# Patient Record
Sex: Female | Born: 1935 | Race: White | Hispanic: No | Smoking: Current every day smoker
Health system: Southern US, Community
[De-identification: ages and names within clinical notes are randomized; demographics above are authoritative.]

## PROBLEM LIST (undated history)

## (undated) DIAGNOSIS — K589 Irritable bowel syndrome without diarrhea: Secondary | ICD-10-CM

## (undated) DIAGNOSIS — L509 Urticaria, unspecified: Secondary | ICD-10-CM

## (undated) DIAGNOSIS — G5600 Carpal tunnel syndrome, unspecified upper limb: Secondary | ICD-10-CM

## (undated) DIAGNOSIS — Z90710 Acquired absence of both cervix and uterus: Secondary | ICD-10-CM

## (undated) DIAGNOSIS — K529 Noninfective gastroenteritis and colitis, unspecified: Secondary | ICD-10-CM

## (undated) DIAGNOSIS — J309 Allergic rhinitis, unspecified: Secondary | ICD-10-CM

## (undated) DIAGNOSIS — F32A Depression, unspecified: Secondary | ICD-10-CM

## (undated) DIAGNOSIS — M1990 Unspecified osteoarthritis, unspecified site: Secondary | ICD-10-CM

## (undated) DIAGNOSIS — L239 Allergic contact dermatitis, unspecified cause: Secondary | ICD-10-CM

## (undated) DIAGNOSIS — D649 Anemia, unspecified: Secondary | ICD-10-CM

## (undated) DIAGNOSIS — F329 Major depressive disorder, single episode, unspecified: Secondary | ICD-10-CM

## (undated) DIAGNOSIS — K5790 Diverticulosis of intestine, part unspecified, without perforation or abscess without bleeding: Secondary | ICD-10-CM

## (undated) DIAGNOSIS — I10 Essential (primary) hypertension: Secondary | ICD-10-CM

## (undated) DIAGNOSIS — Z9889 Other specified postprocedural states: Secondary | ICD-10-CM

## (undated) DIAGNOSIS — R0989 Other specified symptoms and signs involving the circulatory and respiratory systems: Secondary | ICD-10-CM

## (undated) DIAGNOSIS — R0789 Other chest pain: Secondary | ICD-10-CM

## (undated) DIAGNOSIS — L309 Dermatitis, unspecified: Secondary | ICD-10-CM

## (undated) DIAGNOSIS — R112 Nausea with vomiting, unspecified: Secondary | ICD-10-CM

## (undated) HISTORY — PX: CARPAL TUNNEL RELEASE: SHX101

## (undated) HISTORY — PX: ABDOMINAL HYSTERECTOMY: SHX81

## (undated) HISTORY — PX: JOINT REPLACEMENT: SHX530

---

## 2001-02-12 HISTORY — PX: OOPHORECTOMY: SHX86

## 2001-02-12 HISTORY — PX: RECTAL SURGERY: SHX760

## 2003-09-03 ENCOUNTER — Other Ambulatory Visit: Payer: Self-pay

## 2004-01-12 ENCOUNTER — Ambulatory Visit: Payer: Self-pay | Admitting: Obstetrics and Gynecology

## 2004-03-14 ENCOUNTER — Ambulatory Visit: Payer: Self-pay | Admitting: Gastroenterology

## 2004-05-25 ENCOUNTER — Ambulatory Visit: Payer: Self-pay | Admitting: Orthopaedic Surgery

## 2004-06-15 ENCOUNTER — Ambulatory Visit: Payer: Self-pay | Admitting: Orthopaedic Surgery

## 2004-06-15 ENCOUNTER — Other Ambulatory Visit: Payer: Self-pay

## 2004-06-22 ENCOUNTER — Ambulatory Visit: Payer: Self-pay | Admitting: Orthopaedic Surgery

## 2004-07-11 ENCOUNTER — Ambulatory Visit: Payer: Self-pay | Admitting: Internal Medicine

## 2005-03-26 ENCOUNTER — Ambulatory Visit: Payer: Self-pay | Admitting: Gastroenterology

## 2005-08-03 ENCOUNTER — Ambulatory Visit: Payer: Self-pay | Admitting: Internal Medicine

## 2006-02-12 HISTORY — PX: BACK SURGERY: SHX140

## 2006-08-06 ENCOUNTER — Ambulatory Visit: Payer: Self-pay | Admitting: Internal Medicine

## 2006-08-20 ENCOUNTER — Inpatient Hospital Stay: Payer: Self-pay | Admitting: Internal Medicine

## 2006-08-23 ENCOUNTER — Inpatient Hospital Stay (HOSPITAL_COMMUNITY): Admission: AD | Admit: 2006-08-23 | Discharge: 2006-08-25 | Payer: Self-pay | Admitting: Neurological Surgery

## 2006-10-11 ENCOUNTER — Ambulatory Visit (HOSPITAL_COMMUNITY): Admission: RE | Admit: 2006-10-11 | Discharge: 2006-10-11 | Payer: Self-pay | Admitting: Neurological Surgery

## 2006-10-11 ENCOUNTER — Ambulatory Visit: Payer: Self-pay | Admitting: Surgery

## 2006-10-11 ENCOUNTER — Encounter (INDEPENDENT_AMBULATORY_CARE_PROVIDER_SITE_OTHER): Payer: Self-pay | Admitting: Neurological Surgery

## 2007-09-18 ENCOUNTER — Ambulatory Visit: Payer: Self-pay | Admitting: Internal Medicine

## 2007-10-01 ENCOUNTER — Ambulatory Visit: Payer: Self-pay | Admitting: Internal Medicine

## 2007-11-02 ENCOUNTER — Emergency Department: Payer: Self-pay | Admitting: Emergency Medicine

## 2007-12-10 ENCOUNTER — Ambulatory Visit: Payer: Self-pay | Admitting: Internal Medicine

## 2008-04-28 ENCOUNTER — Ambulatory Visit: Payer: Self-pay | Admitting: Internal Medicine

## 2008-10-06 ENCOUNTER — Ambulatory Visit: Payer: Self-pay | Admitting: Internal Medicine

## 2009-08-18 ENCOUNTER — Ambulatory Visit: Payer: Self-pay | Admitting: Ophthalmology

## 2009-08-30 ENCOUNTER — Ambulatory Visit: Payer: Self-pay | Admitting: Ophthalmology

## 2009-09-14 ENCOUNTER — Ambulatory Visit: Payer: Self-pay | Admitting: Ophthalmology

## 2009-10-07 ENCOUNTER — Ambulatory Visit: Payer: Self-pay | Admitting: Internal Medicine

## 2010-06-27 NOTE — H&P (Signed)
NAMECARYSSA, Powell NO.:  [id number]   MEDICAL RECORD NO.:  [id number]          PATIENT TYPE:  INP   LOCATION:  3005                         FACILITY:  [facility]   PHYSICIAN:  Stefani Dama, M.D.  DATE OF BIRTH:  08-May-1932   DATE OF ADMISSION:  08/23/2006  DATE OF DISCHARGE:                              HISTORY & PHYSICAL   CHIEF COMPLAINT:  Severe back and right leg pain with weakness.   HISTORY OF PRESENT ILLNESS:  This is a 77 year old individual who has  had significant problems with back pain chronically, however, this past  Monday she developed severe pain in the right lower extremity that was  unrelenting.  She notes that she developed weakness in the right leg and  the leg felt numb.  She was hospitalized around about Wednesday at  [facility] by Dr. Maurice March and an MRI was undertaken and the study  demonstrates that the patient has severe degenerative changes at the  level of L4-L5 particularly but now there is an acute disk herniation at  the L4-L5 level.  The patient is transferred here for further care.  She  notes that she has had chronic problems with back pain but this has been  felt to be nonsurgical and this has been managed conservatively.  She  notes that her general health is fair.  She has some hypertension and  some high cholesterol.   CURRENT MEDICATIONS:  Her current medications include Avapro, Caltrate,  estradiol 0.25 mg, and Celebrex 200 mg.   ALLERGIES:  She has no known drug allergies save prednisone which she  does not tolerate well but she is allergic to.   SOCIAL HISTORY:  She lives independently.   PHYSICAL EXAMINATION:  NEUROLOGICAL:  On physical examination she is an  alert and oriented, cooperative individual in moderate distress with  back pain.  She turns about very slowly from side-to-side, notes that  there is a sharp, lancinating pain radiating into the right lower  extremity when she does so.  She cannot stand  independently and when she  does stand she tends to favor a 10 degree forward stoop.  She notes that  getting into the requisition is extremely painful.  There is marked  paraspinous spasm to palpation and  percussion with the patient in the  semi erect position.  Her motor strength reveals iliopsoas and  quadriceps strength are 5/5, tibialis anterior, however, is weak to 2/5,  gastroc strength is 4+/5.  Deep tendon reflexes are 2+ in the patellae  and 1+ the Achilles.  Babinski's are downgoing.  Sensation appears  intact in the proximal lower extremity, however, is decreased around the  ankle on the right side.  GENERAL:  The general physical examination reveals the patient is a  moderately obese individual.  HEENT: Examination is normal.  NECK:  The neck is normal, movements are supple.  Range of motion is  good.  No masses are noted in the neck and no bruits are heard.  There  are no masses in the supraclavicular fossa.  LUNGS:  The lungs are  clear to auscultation.  HEART:  The heart has a regular rate and rhythm.  ABDOMEN:  The abdomen is soft and protuberant.  Bowel sounds are  positive.  No masses are palpable.  EXTREMITIES:  The extremities reveal no cyanosis, clubbing, or edema.   IMPRESSION:  The patient has an acute herniated nucleus pulposus on top  of a spondylitic joint at L4-L5 on the right side.  She has an acute  foot drop and so far her pain has been intractable despite parenteral  narcotics.  She has been advised regarding surgical intervention.  She  is now being taken to the operating room to undergo surgical  decompression at L4-L5.      Stefani Dama, M.D.  Electronically Signed     HJE/MEDQ  D:  08/23/2006  T:  08/25/2006  Job:  [id number]   cc:   Stefani Dama, M.D.

## 2010-06-27 NOTE — Discharge Summary (Signed)
[name], [name] NO.:  [id number]   MEDICAL RECORD NO.:  [id number]          PATIENT TYPE:  INP   LOCATION:  3005                         FACILITY:  [facility]   PHYSICIAN:  Stefani Dama, M.D.  DATE OF BIRTH:  [birth year]   DATE OF ADMISSION:  08/23/2006  DATE OF DISCHARGE:  08/25/2006                               DISCHARGE SUMMARY   ADMITTING DIAGNOSIS:  Herniated nucleus pulposus, L4-L5, with right  lumbar radiculopathy, spondylosis and stenosis, lumbar spine.   POSTOPERATIVE DISCHARGE/FINAL DIAGNOSIS:  Herniated nucleus pulposus, L4-  L5, with right lumbar radiculopathy, spondylosis and stenosis, lumbar  spine.   MAJOR OPERATION:  Lumbar microdiskectomy, L4-L5, right, with operating  microscope microdissection technique.   CONDITION ON DISCHARGE:  Improving.   HOSPITAL COURSE:  Paula Powell is a 77 year old individual who was  hospitalized at [facility] because of severe unrelenting pain and  weakness in the right lower extremity.  She has a known history of  spondylosis of the lumbar spine with anterolisthesis and  spondylolisthesis at L4-L5.  An MRI was performed at [facility] which  demonstrated the presence of a herniated nucleus pulposus under the L5  nerve root on that right side.  When she arrived she at the hospital,  she was having poor pain management despite parenteral morphine.  She  had marked weakness with 0/5 tibialis anterior function.  She was  advised regarding surgical extirpation and was taken to the operating  room on the evening of August 23, 2006.  She underwent a microdiskectomy.  Postoperatively she had moderate relief of her pain, and she had some  restoration of function with now 2-3/5 tibialis anterior function in  that right foot.  Her incision is clean and dry.  She is off of  parenteral pain medications and easily tolerating Percocet for pain  control.  She is ambulatory with the aid of a walker, and at this time,  she  is discharged home.  She will be followed up in 2-3 weeks time as an  outpatient.  Her condition on discharge is improving.      Stefani Dama, M.D.  Electronically Signed     HJE/MEDQ  D:  08/25/2006  T:  08/26/2006  Job:  [id number]   cc:   Juanetta Beets, MD

## 2010-06-27 NOTE — Op Note (Signed)
Paula, Powell NO.:  [id number]   MEDICAL RECORD NO.:  [id number]          PATIENT TYPE:  INP   LOCATION:  3005                         FACILITY:  [facility]   PHYSICIAN:  Stefani Dama, M.D.  DATE OF BIRTH:  1932/12/09   DATE OF PROCEDURE:  08/23/2006  DATE OF DISCHARGE:                               OPERATIVE REPORT   PREOPERATIVE DIAGNOSIS:  Herniated nucleus pulposus, L4-L5, right with  right lumbar radiculopathy.   POSTOPERATIVE DIAGNOSIS:  Herniated nucleus pulposus, L4-L5, right with  right lumbar radiculopathy.   PROCEDURE:  Right microdiskectomy of L4-L5 with operating microscope and  microdissection technique.   SURGEON:  Stefani Dama, M.D.   FIRST ASSISTANT:  Hilda Lias, M.D.   ANESTHESIA:  General endotracheal.   INDICATIONS:  Paula Powell is a 77 year old individual, who has had  significant back pain with right lower extremity pain and weakness in  the right foot.  She developed an acute footdrop earlier this week.  She  was hospitalized at. __________  Hospital, and after an MRI demonstrated  the presence of a disk herniation and tight stenosis at the L4-L5 level,  she was transferred here for further care.  She is taken to the  operating room now.   PROCEDURE:  The patient was brought to the operating room supine on the  stretcher.  After smooth induction of general endotracheal anesthesia,  she was turned prone.  The back was prepped with alcohol and DuraPrep  and draped in a sterile fashion.  A midline incision was created over  the region of L4-L5.  This dissection was carried down to the right side  of midline.  Paraspinous fascia was opened at the L4-L5 interspace, and  a subperiosteal dissection was performed so as to expose the  interlaminar space at L4-L5.  A self-retaining retractor was placed in  the wound. A laminotomy was created, removing the inferior margin of  lamina of L4 out to the medial wall of facette.   Dissection was taken  cephalad and laterally.  The common dural tube was exposed, and the  takeoff of the L5 nerve root was exposed, also.  The nerve root was  noted be pushed laterally and dorsally with a mass underlying it right  in the region of the axilla.  With dissection of the foramen, the nerve  root could be mobilized superiorly and medially.  Underneath this was  found a significant free fragment of disk material.  This was removed in  a single fragment and allowed for immediate decompression of L5 nerve  root.  Further dissection revealed some small fragments of disk  material.  The disk itself did present with any significant hole within  it.  The disk space itself and ligaments were covered all around.  It  was, therefore, not entered.  With the L5 nerve root being well  decompressed with this procedure, care was taken to inspect that the  common dural tube was intact throughout the site of the laminotomy.  Hemostasis in the soft tissues was obtained meticulously. The wound was  irrigated  copiously with antibiotic irrigating solution, and then 40 mg  of Depo-Medrol along with 1 cc of fentanyl was left in the epidural  space. The microscope was withdrawn.  Lumbodorsal fascia was closed #1  Vicryl interrupted fashion,  2-0 Vicryl was used in the subcutaneous  tissues, 3-0 Vicryl subcuticularly, and dry sterile dressing was used on  the skin.  The patient tolerated the procedure well and was returned to  recovery room in stable condition.      Stefani Dama, M.D.  Electronically Signed     HJE/MEDQ  D:  08/23/2006  T:  08/25/2006  Job:  [id number]

## 2010-11-28 ENCOUNTER — Ambulatory Visit: Payer: Self-pay | Admitting: Internal Medicine

## 2010-11-28 LAB — URINALYSIS, ROUTINE W REFLEX MICROSCOPIC
Bilirubin Urine: NEGATIVE
Specific Gravity, Urine: 1.011
Urobilinogen, UA: 0.2

## 2010-11-28 LAB — CBC
Hemoglobin: 13.8
RBC: 4.28
WBC: 7.8

## 2010-11-28 LAB — BASIC METABOLIC PANEL
CO2: 26
Calcium: 8.9
GFR calc Af Amer: >60
GFR calc non Af Amer: >60
Sodium: 138

## 2010-11-28 LAB — PROTIME-INR: INR: 1

## 2010-11-28 LAB — URINE MICROSCOPIC-ADD ON

## 2011-07-28 ENCOUNTER — Emergency Department: Payer: Self-pay | Admitting: Emergency Medicine

## 2011-11-29 ENCOUNTER — Ambulatory Visit: Payer: Self-pay | Admitting: Internal Medicine

## 2012-12-01 ENCOUNTER — Ambulatory Visit: Payer: Self-pay | Admitting: Family Medicine

## 2013-05-29 ENCOUNTER — Ambulatory Visit: Payer: Self-pay | Admitting: Gastroenterology

## 2013-06-04 ENCOUNTER — Ambulatory Visit: Payer: Self-pay | Admitting: Gastroenterology

## 2013-06-08 LAB — PATHOLOGY REPORT

## 2013-07-01 ENCOUNTER — Other Ambulatory Visit: Payer: Self-pay | Admitting: Family

## 2013-07-07 ENCOUNTER — Other Ambulatory Visit: Payer: Self-pay | Admitting: Family

## 2013-07-07 LAB — CLOSTRIDIUM DIFFICILE(ARMC)

## 2013-12-03 ENCOUNTER — Ambulatory Visit: Payer: Self-pay | Admitting: Family Medicine

## 2014-07-20 ENCOUNTER — Encounter: Payer: Self-pay | Admitting: *Deleted

## 2014-07-21 ENCOUNTER — Encounter: Payer: Self-pay | Admitting: *Deleted

## 2014-07-22 ENCOUNTER — Ambulatory Visit: Payer: PPO | Admitting: Anesthesiology

## 2014-07-22 ENCOUNTER — Encounter
Admission: RE | Disposition: A | Discharge status: Home / Self Care | Payer: Self-pay | Source: Ambulatory Visit | Attending: Gastroenterology

## 2014-07-22 ENCOUNTER — Ambulatory Visit
Admission: RE | Admit: 2014-07-22 | Discharge: 2014-07-22 | Disposition: A | Discharge status: Home / Self Care | Payer: PPO | Source: Ambulatory Visit | Attending: Gastroenterology | Admitting: Gastroenterology

## 2014-07-22 DIAGNOSIS — Z86010 Personal history of colonic polyps: Secondary | ICD-10-CM | POA: Diagnosis not present

## 2014-07-22 DIAGNOSIS — Z79899 Other long term (current) drug therapy: Secondary | ICD-10-CM | POA: Insufficient documentation

## 2014-07-22 DIAGNOSIS — Z90710 Acquired absence of both cervix and uterus: Secondary | ICD-10-CM | POA: Insufficient documentation

## 2014-07-22 DIAGNOSIS — D649 Anemia, unspecified: Secondary | ICD-10-CM | POA: Insufficient documentation

## 2014-07-22 DIAGNOSIS — L239 Allergic contact dermatitis, unspecified cause: Secondary | ICD-10-CM | POA: Insufficient documentation

## 2014-07-22 DIAGNOSIS — D124 Benign neoplasm of descending colon: Secondary | ICD-10-CM | POA: Insufficient documentation

## 2014-07-22 DIAGNOSIS — F329 Major depressive disorder, single episode, unspecified: Secondary | ICD-10-CM | POA: Diagnosis not present

## 2014-07-22 DIAGNOSIS — K529 Noninfective gastroenteritis and colitis, unspecified: Secondary | ICD-10-CM | POA: Diagnosis not present

## 2014-07-22 DIAGNOSIS — I10 Essential (primary) hypertension: Secondary | ICD-10-CM | POA: Diagnosis not present

## 2014-07-22 DIAGNOSIS — Z7982 Long term (current) use of aspirin: Secondary | ICD-10-CM | POA: Diagnosis not present

## 2014-07-22 DIAGNOSIS — E785 Hyperlipidemia, unspecified: Secondary | ICD-10-CM | POA: Diagnosis not present

## 2014-07-22 DIAGNOSIS — M170 Bilateral primary osteoarthritis of knee: Secondary | ICD-10-CM | POA: Diagnosis not present

## 2014-07-22 DIAGNOSIS — K5790 Diverticulosis of intestine, part unspecified, without perforation or abscess without bleeding: Secondary | ICD-10-CM | POA: Diagnosis not present

## 2014-07-22 DIAGNOSIS — K589 Irritable bowel syndrome without diarrhea: Secondary | ICD-10-CM | POA: Diagnosis not present

## 2014-07-22 DIAGNOSIS — Z888 Allergy status to other drugs, medicaments and biological substances status: Secondary | ICD-10-CM | POA: Diagnosis not present

## 2014-07-22 DIAGNOSIS — R0789 Other chest pain: Secondary | ICD-10-CM | POA: Diagnosis not present

## 2014-07-22 DIAGNOSIS — K5730 Diverticulosis of large intestine without perforation or abscess without bleeding: Secondary | ICD-10-CM | POA: Insufficient documentation

## 2014-07-22 DIAGNOSIS — K5190 Ulcerative colitis, unspecified, without complications: Secondary | ICD-10-CM | POA: Diagnosis not present

## 2014-07-22 DIAGNOSIS — R0989 Other specified symptoms and signs involving the circulatory and respiratory systems: Secondary | ICD-10-CM | POA: Insufficient documentation

## 2014-07-22 HISTORY — DX: Major depressive disorder, single episode, unspecified: F32.9

## 2014-07-22 HISTORY — DX: Urticaria, unspecified: L50.9

## 2014-07-22 HISTORY — DX: Unspecified osteoarthritis, unspecified site: M19.90

## 2014-07-22 HISTORY — DX: Other specified symptoms and signs involving the circulatory and respiratory systems: R09.89

## 2014-07-22 HISTORY — DX: Other chest pain: R07.89

## 2014-07-22 HISTORY — DX: Diverticulosis of intestine, part unspecified, without perforation or abscess without bleeding: K57.90

## 2014-07-22 HISTORY — PX: COLONOSCOPY WITH PROPOFOL: SHX5780

## 2014-07-22 HISTORY — DX: Acquired absence of both cervix and uterus: Z90.710

## 2014-07-22 HISTORY — DX: Noninfective gastroenteritis and colitis, unspecified: K52.9

## 2014-07-22 HISTORY — DX: Allergic contact dermatitis, unspecified cause: L23.9

## 2014-07-22 HISTORY — DX: Irritable bowel syndrome without diarrhea: K58.9

## 2014-07-22 HISTORY — DX: Depression, unspecified: F32.A

## 2014-07-22 HISTORY — DX: Allergic rhinitis, unspecified: J30.9

## 2014-07-22 HISTORY — DX: Anemia, unspecified: D64.9

## 2014-07-22 HISTORY — DX: Dermatitis, unspecified: L30.9

## 2014-07-22 HISTORY — DX: Essential (primary) hypertension: I10

## 2014-07-22 HISTORY — DX: Carpal tunnel syndrome, unspecified upper limb: G56.00

## 2014-07-22 SURGERY — COLONOSCOPY WITH PROPOFOL
Anesthesia: General

## 2014-07-22 MED ORDER — SODIUM CHLORIDE 0.9 % IV SOLN: INTRAVENOUS | Status: DC

## 2014-07-22 MED ORDER — PROPOFOL INFUSION 10 MG/ML OPTIME
INTRAVENOUS | Status: DC | PRN
  Administered 2014-07-22: 120 ug/kg/min via INTRAVENOUS

## 2014-07-22 MED ORDER — SODIUM CHLORIDE 0.9 % IV SOLN
INTRAVENOUS | Status: DC
  Administered 2014-07-22: 13:00:00 via INTRAVENOUS
  Administered 2014-07-22: 1000 mL via INTRAVENOUS

## 2014-07-22 MED ORDER — PROPOFOL 10 MG/ML IV BOLUS
INTRAVENOUS | Status: DC | PRN
  Administered 2014-07-22: 50 mg via INTRAVENOUS
  Administered 2014-07-22: 30 mg via INTRAVENOUS

## 2014-07-22 MED ORDER — FENTANYL CITRATE (PF) 100 MCG/2ML IJ SOLN
INTRAMUSCULAR | Status: DC | PRN
  Administered 2014-07-22: 50 ug via INTRAVENOUS

## 2014-07-22 NOTE — Anesthesia Preprocedure Evaluation (Addendum)
Anesthesia Evaluation  Patient identified by MRN, date of birth, ID band Patient awake    Reviewed: Allergy & Precautions, NPO status , Patient's Chart, lab work & pertinent test results  History of Anesthesia Complications Negative for: history of anesthetic complications  Airway Mallampati: II  TM Distance: >3 FB Neck ROM: Full    Dental  (+) Upper Dentures, Partial Lower   Pulmonary neg pulmonary ROS,  breath sounds clear to auscultation  Pulmonary exam normal       Cardiovascular hypertension, Pt. on medications Normal cardiovascular exam+ Valvular Problems/Murmurs Rhythm:Regular Rate:Normal     Neuro/Psych PSYCHIATRIC DISORDERS Anxiety Depression negative neurological ROS     GI/Hepatic Neg liver ROS, IBS   Endo/Other  negative endocrine ROS  Renal/GU negative Renal ROS  negative genitourinary   Musculoskeletal  (+) Arthritis -, Osteoarthritis,    Abdominal   Peds negative pediatric ROS (+)  Hematology  (+) anemia ,   Anesthesia Other Findings   Reproductive/Obstetrics negative OB ROS                            Anesthesia Physical Anesthesia Plan  ASA: II  Anesthesia Plan: General   Post-op Pain Management:    Induction: Intravenous  Airway Management Planned: Nasal Cannula  Additional Equipment:   Intra-op Plan:   Post-operative Plan:   Informed Consent: I have reviewed the patients History and Physical, chart, labs and discussed the procedure including the risks, benefits and alternatives for the proposed anesthesia with the patient or authorized representative who has indicated his/her understanding and acceptance.     Plan Discussed with: CRNA and Surgeon  Anesthesia Plan Comments:         Anesthesia Quick Evaluation

## 2014-07-22 NOTE — H&P (Signed)
  Date of Initial H&P:07/01/2014 History reviewed, patient examined, no change in status, stable for surgery.

## 2014-07-22 NOTE — Op Note (Signed)
[facility] Gastroenterology Patient Name: Paula Powell Procedure Date: 07/22/2014 1:23 PM MRN: [id number] Account #: [id number] Date of Birth: 10-Sep-1932 Admit Type: Outpatient Age: 81 Room: [facility] ENDO ROOM 4 Gender: Female Note Status: Finalized Procedure:         Colonoscopy Indications:       Follow-up of left-sided chronic ulcerative colitis Providers:         Lupita Dawn. Candace Cruise, MD Referring MD:      Caprice Renshaw (Referring MD) Medicines:         Monitored Anesthesia Care Complications:     No immediate complications. Procedure:         Pre-Anesthesia Assessment:                    - Prior to the procedure, a History and Physical was                     performed, and patient medications, allergies and                     sensitivities were reviewed. The patient's tolerance of                     previous anesthesia was reviewed.                    - The risks and benefits of the procedure and the sedation                     options and risks were discussed with the patient. All                     questions were answered and informed consent was obtained.                    - After reviewing the risks and benefits, the patient was                     deemed in satisfactory condition to undergo the procedure.                    After obtaining informed consent, the colonoscope was                     passed under direct vision. Throughout the procedure, the                     patient's blood pressure, pulse, and oxygen saturations                     were monitored continuously. The Colonoscope was                     introduced through the anus and advanced to the the cecum,                     identified by appendiceal orifice and ileocecal valve. The                     colonoscopy was performed without difficulty. The patient                     tolerated the procedure well. The quality of the bowel  preparation was good. Findings:  Scattered small-mouthed diverticula were found in the entire colon.      A small polyp was found in the descending colon. The polyp was sessile.       The polyp was removed with a jumbo cold forceps. Resection and retrieval       were complete.      A continuous area of nonbleeding ulcerated mucosa was present in the       sigmoid colon. Biopsies were taken with a cold forceps for histology.      A continuous area of nonbleeding ulcerated mucosa was present in the       rectum. Biopsies were taken with a cold forceps for histology.       Inflammation much improved from 2015. Descending colon and right colon       not involved.      The exam was otherwise without abnormality. Impression:        - Diverticulosis in the entire examined colon.                    - One small polyp in the descending colon. Resected and                     retrieved.                    - Mucosal ulceration. Biopsied.                    - Mucosal ulceration. Biopsied.                    - The examination was otherwise normal. Recommendation:    - Discharge patient to home.                    - Await pathology results.                    - The findings and recommendations were discussed with the                     patient.                    - Can try lowering dose to asacol HD bid. Procedure Code(s): --- Professional ---                    [phone], Colonoscopy, flexible; with biopsy, single or                     multiple Diagnosis Code(s): --- Professional ---                    D12.4, Benign neoplasm of descending colon                    K63.3, Ulcer of intestine                    K51.50, Left sided colitis without complications                    K57.30, Diverticulosis of large intestine without                     perforation or abscess without bleeding CPT copyright 2014 [employer]. All rights reserved. The codes documented in this report are preliminary  and upon coder review may  be  revised to meet current compliance requirements. Hulen Luster, MD 07/22/2014 1:43:49 PM This report has been signed electronically. Number of Addenda: 0 Note Initiated On: 07/22/2014 1:23 PM Scope Withdrawal Time: 0 hours 3 minutes 9 seconds  Total Procedure Duration: 0 hours 11 minutes 54 seconds       [facility]

## 2014-07-22 NOTE — Transfer of Care (Signed)
Immediate Anesthesia Transfer of Care Note  Patient: Paula Powell  Procedure(s) Performed: Procedure(s): COLONOSCOPY WITH PROPOFOL (N/A)  Patient Location: PACU  Anesthesia Type:General  Level of Consciousness: awake, alert  and oriented  Airway & Oxygen Therapy: Patient Spontanous Breathing and Patient connected to nasal cannula oxygen  Post-op Assessment: Report given to RN and Post -op Vital signs reviewed and stable  Post vital signs: Reviewed and stable  Last Vitals:  Filed Vitals:   07/22/14 1348  BP: 137/66  Pulse:   Temp: 36.2 C  Resp: 15    Complications: No apparent anesthesia complications

## 2014-07-22 NOTE — Anesthesia Postprocedure Evaluation (Signed)
  Anesthesia Post-op Note  Patient: Paula Powell  Procedure(s) Performed: Procedure(s): COLONOSCOPY WITH PROPOFOL (N/A)  Anesthesia type:General  Patient location: PACU  Post pain: Pain level controlled  Post assessment: Post-op Vital signs reviewed, Patient's Cardiovascular Status Stable, Respiratory Function Stable, Patent Airway and No signs of Nausea or vomiting  Post vital signs: Reviewed and stable  Last Vitals:  Filed Vitals:   07/22/14 1410  BP: 157/74  Pulse: 67  Temp:   Resp: 19    Level of consciousness: awake, alert  and patient cooperative  Complications: No apparent anesthesia complications

## 2014-07-23 ENCOUNTER — Encounter: Payer: Self-pay | Admitting: Gastroenterology

## 2014-07-23 LAB — SURGICAL PATHOLOGY

## 2014-11-24 ENCOUNTER — Other Ambulatory Visit: Payer: Self-pay | Admitting: Family Medicine

## 2014-11-24 DIAGNOSIS — Z1231 Encounter for screening mammogram for malignant neoplasm of breast: Secondary | ICD-10-CM

## 2014-12-06 ENCOUNTER — Ambulatory Visit
Admission: RE | Admit: 2014-12-06 | Discharge: 2014-12-06 | Disposition: A | Discharge status: Home / Self Care | Payer: PPO | Source: Ambulatory Visit | Attending: Family Medicine | Admitting: Family Medicine

## 2014-12-06 DIAGNOSIS — Z1231 Encounter for screening mammogram for malignant neoplasm of breast: Secondary | ICD-10-CM | POA: Diagnosis present

## 2015-05-12 DIAGNOSIS — E7800 Pure hypercholesterolemia, unspecified: Secondary | ICD-10-CM | POA: Diagnosis not present

## 2015-05-12 DIAGNOSIS — Z79899 Other long term (current) drug therapy: Secondary | ICD-10-CM | POA: Diagnosis not present

## 2015-05-19 DIAGNOSIS — Z720 Tobacco use: Secondary | ICD-10-CM | POA: Diagnosis not present

## 2015-05-19 DIAGNOSIS — F411 Generalized anxiety disorder: Secondary | ICD-10-CM | POA: Diagnosis not present

## 2015-05-19 DIAGNOSIS — I10 Essential (primary) hypertension: Secondary | ICD-10-CM | POA: Diagnosis not present

## 2015-05-19 DIAGNOSIS — K5190 Ulcerative colitis, unspecified, without complications: Secondary | ICD-10-CM | POA: Diagnosis not present

## 2015-05-19 DIAGNOSIS — Z79899 Other long term (current) drug therapy: Secondary | ICD-10-CM | POA: Diagnosis not present

## 2015-05-25 ENCOUNTER — Other Ambulatory Visit: Payer: Self-pay | Admitting: Nurse Practitioner

## 2015-05-25 ENCOUNTER — Ambulatory Visit: Admission: RE | Admit: 2015-05-25 | Payer: PPO | Source: Ambulatory Visit

## 2015-05-25 ENCOUNTER — Ambulatory Visit
Admission: RE | Admit: 2015-05-25 | Discharge: 2015-05-25 | Disposition: A | Discharge status: Home / Self Care | Payer: PPO | Source: Ambulatory Visit | Attending: Nurse Practitioner | Admitting: Nurse Practitioner

## 2015-05-25 DIAGNOSIS — N281 Cyst of kidney, acquired: Secondary | ICD-10-CM | POA: Diagnosis not present

## 2015-05-25 DIAGNOSIS — R1032 Left lower quadrant pain: Secondary | ICD-10-CM

## 2015-05-25 DIAGNOSIS — I700 Atherosclerosis of aorta: Secondary | ICD-10-CM | POA: Diagnosis not present

## 2015-05-25 DIAGNOSIS — K529 Noninfective gastroenteritis and colitis, unspecified: Secondary | ICD-10-CM | POA: Diagnosis not present

## 2015-05-25 DIAGNOSIS — Z90710 Acquired absence of both cervix and uterus: Secondary | ICD-10-CM | POA: Diagnosis not present

## 2015-05-25 DIAGNOSIS — K5732 Diverticulitis of large intestine without perforation or abscess without bleeding: Secondary | ICD-10-CM | POA: Insufficient documentation

## 2015-05-25 DIAGNOSIS — K5790 Diverticulosis of intestine, part unspecified, without perforation or abscess without bleeding: Secondary | ICD-10-CM | POA: Diagnosis not present

## 2015-05-25 DIAGNOSIS — K5730 Diverticulosis of large intestine without perforation or abscess without bleeding: Secondary | ICD-10-CM | POA: Insufficient documentation

## 2015-05-25 MED ORDER — IOPAMIDOL (ISOVUE-300) INJECTION 61%
100.0000 mL | Freq: Once | INTRAVENOUS | Status: AC | PRN
  Administered 2015-05-25: 100 mL via INTRAVENOUS

## 2015-08-03 DIAGNOSIS — M25561 Pain in right knee: Secondary | ICD-10-CM | POA: Diagnosis not present

## 2015-08-03 DIAGNOSIS — M1711 Unilateral primary osteoarthritis, right knee: Secondary | ICD-10-CM | POA: Diagnosis not present

## 2015-08-03 DIAGNOSIS — M25562 Pain in left knee: Secondary | ICD-10-CM | POA: Diagnosis not present

## 2015-08-18 DIAGNOSIS — M25562 Pain in left knee: Secondary | ICD-10-CM | POA: Diagnosis not present

## 2015-08-18 DIAGNOSIS — M170 Bilateral primary osteoarthritis of knee: Secondary | ICD-10-CM | POA: Diagnosis not present

## 2015-11-15 DIAGNOSIS — Z79899 Other long term (current) drug therapy: Secondary | ICD-10-CM | POA: Diagnosis not present

## 2015-11-15 DIAGNOSIS — J069 Acute upper respiratory infection, unspecified: Secondary | ICD-10-CM | POA: Diagnosis not present

## 2015-11-15 DIAGNOSIS — J019 Acute sinusitis, unspecified: Secondary | ICD-10-CM | POA: Diagnosis not present

## 2015-11-15 DIAGNOSIS — I10 Essential (primary) hypertension: Secondary | ICD-10-CM | POA: Diagnosis not present

## 2015-11-21 DIAGNOSIS — I10 Essential (primary) hypertension: Secondary | ICD-10-CM | POA: Diagnosis not present

## 2015-11-21 DIAGNOSIS — Z23 Encounter for immunization: Secondary | ICD-10-CM | POA: Diagnosis not present

## 2015-11-21 DIAGNOSIS — Z0000 Encounter for general adult medical examination without abnormal findings: Secondary | ICD-10-CM | POA: Diagnosis not present

## 2015-11-21 DIAGNOSIS — Z79899 Other long term (current) drug therapy: Secondary | ICD-10-CM | POA: Diagnosis not present

## 2015-12-23 ENCOUNTER — Other Ambulatory Visit: Payer: Self-pay | Admitting: Family Medicine

## 2015-12-23 DIAGNOSIS — Z1231 Encounter for screening mammogram for malignant neoplasm of breast: Secondary | ICD-10-CM

## 2016-01-27 ENCOUNTER — Ambulatory Visit: Payer: PPO

## 2016-02-17 ENCOUNTER — Ambulatory Visit: Payer: PPO

## 2016-02-17 DIAGNOSIS — B301 Conjunctivitis due to adenovirus: Secondary | ICD-10-CM | POA: Diagnosis not present

## 2016-03-15 ENCOUNTER — Ambulatory Visit
Admission: RE | Admit: 2016-03-15 | Discharge: 2016-03-15 | Disposition: A | Discharge status: Home / Self Care | Payer: PPO | Source: Ambulatory Visit | Attending: Family Medicine | Admitting: Family Medicine

## 2016-03-15 DIAGNOSIS — Z1231 Encounter for screening mammogram for malignant neoplasm of breast: Secondary | ICD-10-CM | POA: Insufficient documentation

## 2016-03-21 DIAGNOSIS — M1711 Unilateral primary osteoarthritis, right knee: Secondary | ICD-10-CM | POA: Diagnosis not present

## 2016-03-21 DIAGNOSIS — M25562 Pain in left knee: Secondary | ICD-10-CM | POA: Diagnosis not present

## 2016-05-09 DIAGNOSIS — K51219 Ulcerative (chronic) proctitis with unspecified complications: Secondary | ICD-10-CM | POA: Diagnosis not present

## 2016-05-09 DIAGNOSIS — R1032 Left lower quadrant pain: Secondary | ICD-10-CM | POA: Diagnosis not present

## 2016-05-09 DIAGNOSIS — K51911 Ulcerative colitis, unspecified with rectal bleeding: Secondary | ICD-10-CM | POA: Diagnosis not present

## 2016-05-09 DIAGNOSIS — R1031 Right lower quadrant pain: Secondary | ICD-10-CM | POA: Diagnosis not present

## 2016-05-09 DIAGNOSIS — R197 Diarrhea, unspecified: Secondary | ICD-10-CM | POA: Diagnosis not present

## 2016-05-14 DIAGNOSIS — Z79899 Other long term (current) drug therapy: Secondary | ICD-10-CM | POA: Diagnosis not present

## 2016-05-14 DIAGNOSIS — I10 Essential (primary) hypertension: Secondary | ICD-10-CM | POA: Diagnosis not present

## 2016-05-15 DIAGNOSIS — K51911 Ulcerative colitis, unspecified with rectal bleeding: Secondary | ICD-10-CM | POA: Diagnosis not present

## 2016-05-15 DIAGNOSIS — Z7689 Persons encountering health services in other specified circumstances: Secondary | ICD-10-CM | POA: Diagnosis not present

## 2016-05-15 DIAGNOSIS — R1032 Left lower quadrant pain: Secondary | ICD-10-CM | POA: Diagnosis not present

## 2016-05-15 DIAGNOSIS — R197 Diarrhea, unspecified: Secondary | ICD-10-CM | POA: Diagnosis not present

## 2016-05-15 DIAGNOSIS — R1031 Right lower quadrant pain: Secondary | ICD-10-CM | POA: Diagnosis not present

## 2016-05-21 DIAGNOSIS — Z79899 Other long term (current) drug therapy: Secondary | ICD-10-CM | POA: Diagnosis not present

## 2016-05-21 DIAGNOSIS — R739 Hyperglycemia, unspecified: Secondary | ICD-10-CM | POA: Diagnosis not present

## 2016-05-21 DIAGNOSIS — I10 Essential (primary) hypertension: Secondary | ICD-10-CM | POA: Diagnosis not present

## 2016-05-21 DIAGNOSIS — F3341 Major depressive disorder, recurrent, in partial remission: Secondary | ICD-10-CM | POA: Diagnosis not present

## 2016-05-21 DIAGNOSIS — K5190 Ulcerative colitis, unspecified, without complications: Secondary | ICD-10-CM | POA: Diagnosis not present

## 2016-06-15 ENCOUNTER — Emergency Department: Payer: PPO

## 2016-06-15 ENCOUNTER — Emergency Department
Admission: EM | Admit: 2016-06-15 | Discharge: 2016-06-15 | Disposition: A | Discharge status: Home / Self Care | Payer: PPO | Attending: Emergency Medicine | Admitting: Emergency Medicine

## 2016-06-15 ENCOUNTER — Encounter: Payer: Self-pay | Admitting: Emergency Medicine

## 2016-06-15 DIAGNOSIS — S61213A Laceration without foreign body of left middle finger without damage to nail, initial encounter: Secondary | ICD-10-CM | POA: Diagnosis not present

## 2016-06-15 DIAGNOSIS — Y92009 Unspecified place in unspecified non-institutional (private) residence as the place of occurrence of the external cause: Secondary | ICD-10-CM | POA: Diagnosis not present

## 2016-06-15 DIAGNOSIS — S0083XA Contusion of other part of head, initial encounter: Secondary | ICD-10-CM | POA: Diagnosis not present

## 2016-06-15 DIAGNOSIS — I10 Essential (primary) hypertension: Secondary | ICD-10-CM | POA: Insufficient documentation

## 2016-06-15 DIAGNOSIS — Y939 Activity, unspecified: Secondary | ICD-10-CM | POA: Insufficient documentation

## 2016-06-15 DIAGNOSIS — S6992XA Unspecified injury of left wrist, hand and finger(s), initial encounter: Secondary | ICD-10-CM | POA: Diagnosis not present

## 2016-06-15 DIAGNOSIS — S8001XA Contusion of right knee, initial encounter: Secondary | ICD-10-CM | POA: Insufficient documentation

## 2016-06-15 DIAGNOSIS — M25562 Pain in left knee: Secondary | ICD-10-CM | POA: Diagnosis not present

## 2016-06-15 DIAGNOSIS — S0990XA Unspecified injury of head, initial encounter: Secondary | ICD-10-CM | POA: Diagnosis not present

## 2016-06-15 DIAGNOSIS — Z79899 Other long term (current) drug therapy: Secondary | ICD-10-CM | POA: Insufficient documentation

## 2016-06-15 DIAGNOSIS — Y999 Unspecified external cause status: Secondary | ICD-10-CM | POA: Diagnosis not present

## 2016-06-15 DIAGNOSIS — S8002XA Contusion of left knee, initial encounter: Secondary | ICD-10-CM | POA: Diagnosis not present

## 2016-06-15 DIAGNOSIS — R9431 Abnormal electrocardiogram [ECG] [EKG]: Secondary | ICD-10-CM | POA: Diagnosis not present

## 2016-06-15 DIAGNOSIS — W19XXXA Unspecified fall, initial encounter: Secondary | ICD-10-CM | POA: Diagnosis not present

## 2016-06-15 DIAGNOSIS — W1839XA Other fall on same level, initial encounter: Secondary | ICD-10-CM | POA: Insufficient documentation

## 2016-06-15 DIAGNOSIS — S8991XA Unspecified injury of right lower leg, initial encounter: Secondary | ICD-10-CM | POA: Diagnosis not present

## 2016-06-15 DIAGNOSIS — S0993XA Unspecified injury of face, initial encounter: Secondary | ICD-10-CM | POA: Diagnosis not present

## 2016-06-15 DIAGNOSIS — M25561 Pain in right knee: Secondary | ICD-10-CM | POA: Diagnosis not present

## 2016-06-15 MED ORDER — ACETAMINOPHEN 325 MG PO TABS
650.0000 mg | ORAL_TABLET | Freq: Once | ORAL | Status: AC
  Administered 2016-06-15: 650 mg via ORAL
  Filled 2016-06-15: qty 2

## 2016-06-15 NOTE — ED Notes (Signed)
Ace to right knee. Cut to knee and head and right elbow cleaned with wipes.

## 2016-06-15 NOTE — ED Triage Notes (Signed)
Pt via ems from home after mechanical fall. Pt states her knee hurts - knee is swollen. Pt has abrasion and hematoma to forehead, as well as superficial cut to left middle finger. Pt states she did not attempt to walk after the fall due to the knee pain. Pt states she has a lot of pain in the knee normally.

## 2016-06-15 NOTE — ED Provider Notes (Signed)
[facility] Emergency Department Provider Note  ____________________________________________   First MD Initiated Contact with Patient 06/15/16 0935     (approximate)  I have reviewed the triage vital signs and the nursing notes.   HISTORY  Chief Complaint Fall   HPI Paula Powell is a 83 y.o. female who is presenting to the emergency department after a fall. She says that she was coming onto steps outside of her house when she lost her footing and fell into the side of car port. She hit her right knee anteriorly and also the right side of her head. She did not lose consciousness. She is denying any neck pain. She denies taking any blood thinners. Says that she has bilateral knee arthritis and chronic knee pain but now is having worsening knee pain after the fall to the right lower summary. Says that she has had her last tetanus shot in 2012 and would not like to have it updated today but would rather see her primary care doctor to have it done within the next 4 years.Denies loss of consciousness and remembers the entire event.   Past Medical History:  Diagnosis Date  . Allergic eczema   . Anemia   . Carpal tunnel syndrome   . Chest pain, atypical   . Depression   . Depressive disorder   . Diverticulosis   . DJD (degenerative joint disease)   . Eczema   . Essential hypertension   . H/O: hysterectomy   . Hives   . Hyperlipidemia   . IBD (inflammatory bowel disease)   . Irritable bowel syndrome   . Rhinitis, allergic   . Right carotid bruit     There are no active problems to display for this patient.   Past Surgical History:  Procedure Laterality Date  . ABDOMINAL HYSTERECTOMY    . COLONOSCOPY WITH PROPOFOL N/A 07/22/2014   Procedure: COLONOSCOPY WITH PROPOFOL;  Surgeon: Hulen Luster, MD;  Location: [facility] ENDOSCOPY;  Service: Gastroenterology;  Laterality: N/A;    Prior to Admission medications   Medication Sig Start Date End Date Taking?  Authorizing Provider  acetaminophen (TYLENOL) 500 MG tablet Take 1,000 mg by mouth every 6 (six) hours as needed for mild pain.   Yes Historical Provider, MD  citalopram (CELEXA) 10 MG tablet Take 15 mg by mouth daily.    Yes Historical Provider, MD  cyclobenzaprine (FLEXERIL) 10 MG tablet Take 10 mg by mouth 3 (three) times daily as needed for muscle spasms.   Yes Historical Provider, MD  hydrochlorothiazide (HYDRODIURIL) 25 MG tablet Take 25 mg by mouth daily.   Yes Historical Provider, MD  losartan (COZAAR) 100 MG tablet Take 100 mg by mouth daily.   Yes Historical Provider, MD  mesalamine (LIALDA) 1.2 g EC tablet Take 4 tablets by mouth 3 (three) times daily.    Yes Historical Provider, MD    Allergies Ace inhibitors  Family History  Problem Relation Age of Onset  . Breast cancer Paternal Grandmother     Social History Social History  Substance Use Topics  . Smoking status: Never Smoker  . Smokeless tobacco: Never Used  . Alcohol use No    Review of Systems  Constitutional: No fever/chills Eyes: No visual changes. ENT: No sore throat. Cardiovascular: Denies chest pain. Respiratory: Denies shortness of breath. Gastrointestinal: No abdominal pain.  No nausea, no vomiting.  No diarrhea.  No constipation. Genitourinary: Negative for dysuria. Musculoskeletal: Negative for back pain. Skin: Negative for rash. Neurological:  Negative for headaches, focal weakness or numbness.   ____________________________________________   PHYSICAL EXAM:  VITAL SIGNS: ED Triage Vitals  Enc Vitals Group     BP 06/15/16 0932 (!) 145/83     Pulse Rate 06/15/16 0932 66     Resp 06/15/16 0932 13     Temp 06/15/16 0932 98 F (36.7 C)     Temp Source 06/15/16 0932 Oral     SpO2 06/15/16 0932 96 %     Weight 06/15/16 0935 197 lb (89.4 kg)     Height 06/15/16 0935 5\' 6"  (1.676 m)     Head Circumference --      Peak Flow --      Pain Score 06/15/16 0935 8     Pain Loc --      Pain Edu?  --      Excl. in [place]? --     Constitutional: Alert and oriented. Well appearing and in no acute distress. Eyes: Conjunctivae are normal. PERRL. EOMI. Head: Ecchymosis and very superficial abrasion over the right forehead without any active bleeding. Area is about 2 x 3 cm without any depression. Mild tenderness to palpation. Nose: No congestion/rhinnorhea. Mouth/Throat: Mucous membranes are moist.   Neck: No stridor.   Cardiovascular: Normal rate, regular rhythm. Grossly normal heart sounds.   Respiratory: Normal respiratory effort.  No retractions. Lungs CTAB. Gastrointestinal: Soft and nontender. No distention.  Musculoskeletal: Right knee with ecchymosis anteriorly and tenderness to palpation over the anterior right and superior aspect without any effusion. The patient is able to actively range the knee but only to about 30 of flexion secondary to pain. There is no ligamentous laxity. No posterior tenderness to palpation. Neurovascularly intact distal to the site of injury. Neurologic:  Normal speech and language. No gross focal neurologic deficits are appreciated. No gait instability. Skin:  Volar surface of the left middle finger with a 1 cm superficial laceration. Neurovascularly intact with full range of motion of the finger without any bony deformity. No active bleeding, pus or induration or erythema. Psychiatric: Mood and affect are normal. Speech and behavior are normal.  ____________________________________________   LABS (all labs ordered are listed, but only abnormal results are displayed)  Labs Reviewed - No data to display ____________________________________________  EKG  ED ECG REPORT I, Xitlalli Newhard,  Youlanda Roys, the attending physician, personally viewed and interpreted this ECG.   Date: 06/15/2016  EKG Time: 0934  Rate: 72  Rhythm: normal sinus rhythm with PVC times one.  Axis: normal  Intervals:none  ST&T Change: No ST segment elevation or depression. No abnormal  T-wave inversion.  ____________________________________________  RADIOLOGY  CT Head Wo Contrast (Final result)  Result time 06/15/16 10:07:40  Final result by Jennette Banker, MD (06/15/16 10:07:40)           Narrative:   CLINICAL DATA: Pt via ems from home after mechanical fall. Pt states her knee hurts - knee is swollen. Pt has abrasion and hematoma to forehead, as well as superficial cut to left middle finger. Denies LOC  EXAM: CT HEAD WITHOUT CONTRAST  TECHNIQUE: Contiguous axial images were obtained from the base of the skull through the vertex without intravenous contrast.  COMPARISON: None.  FINDINGS: Brain: No evidence of acute infarction, hemorrhage, extra-axial collection, ventriculomegaly, or mass effect. Generalized cerebral atrophy. Periventricular white matter low attenuation likely secondary to microangiopathy.  Vascular: Cerebrovascular atherosclerotic calcifications are noted.  Skull: Negative for fracture or focal lesion.  Sinuses/Orbits: Visualized portions of the orbits  are unremarkable. Visualized portions of the paranasal sinuses and mastoid air cells are unremarkable.  Other: Mild right frontal scalp soft tissue swelling.  IMPRESSION: No acute intracranial pathology.   Electronically Signed By: Kathreen Devoid On: 06/15/2016 10:07            DG Knee Complete 4 Views Right (Final result)  Result time 06/15/16 09:59:01  Final result by Barnet Glasgow, MD (06/15/16 09:59:01)           Narrative:   CLINICAL DATA: Fall. Knee pain  EXAM: RIGHT KNEE - COMPLETE 4+ VIEW  COMPARISON: None.  FINDINGS: Moderate to severe degenerative change in the lateral joint compartment with joint space narrowing and spurring. Mild degenerative change in the medial joint compartment. Advanced degenerative change in the patella.  Moderate joint effusion. Negative for fracture.  IMPRESSION: Moderate to advanced degenerative change with  joint effusion. Negative for fracture.   Electronically Signed By: Franchot Gallo M.D. On: 06/15/2016 09:59          ____________________________________________   PROCEDURES  Procedure(s) performed:   Procedures  Critical Care performed:   ____________________________________________   INITIAL IMPRESSION / ASSESSMENT AND PLAN / ED COURSE  Pertinent labs & imaging results that were available during my care of the patient were reviewed by me and considered in my medical decision making (see chart for details).  ----------------------------------------- 10:25 AM on 06/15/2016 -----------------------------------------  No fractures or acute findings found on the patient's images. Feel the effusion is likely chronic and related to her arthritis. She was able to a bili with assistance. We will place an Ace wrap on the right knee and she'll be following up with her orthopedist, Dr. Rudene Christians, who had been planning on replacing the right knee.      ____________________________________________   FINAL CLINICAL IMPRESSION(S) / ED DIAGNOSES  Fall. Knee contusion. Head injury. Finger laceration.    NEW MEDICATIONS STARTED DURING THIS VISIT:  New Prescriptions   No medications on file     Note:  This document was prepared using Dragon voice recognition software and may include unintentional dictation errors.    Orbie Pyo, MD 06/15/16 1026

## 2016-06-15 NOTE — ED Notes (Signed)
Pt discharged home after verbalizing understanding of discharge instructions; nad noted. 

## 2016-06-15 NOTE — ED Notes (Signed)
Pt to xray

## 2016-07-06 ENCOUNTER — Other Ambulatory Visit: Payer: Self-pay | Admitting: Orthopedic Surgery

## 2016-07-06 DIAGNOSIS — M1711 Unilateral primary osteoarthritis, right knee: Secondary | ICD-10-CM

## 2016-07-25 ENCOUNTER — Ambulatory Visit
Admission: RE | Admit: 2016-07-25 | Discharge: 2016-07-25 | Disposition: A | Discharge status: Home / Self Care | Payer: PPO | Source: Ambulatory Visit | Attending: Orthopedic Surgery | Admitting: Orthopedic Surgery

## 2016-07-25 DIAGNOSIS — Z96651 Presence of right artificial knee joint: Secondary | ICD-10-CM | POA: Diagnosis not present

## 2016-07-25 DIAGNOSIS — Z471 Aftercare following joint replacement surgery: Secondary | ICD-10-CM | POA: Diagnosis not present

## 2016-07-25 DIAGNOSIS — M1711 Unilateral primary osteoarthritis, right knee: Secondary | ICD-10-CM | POA: Insufficient documentation

## 2016-07-27 DIAGNOSIS — M1711 Unilateral primary osteoarthritis, right knee: Secondary | ICD-10-CM | POA: Diagnosis not present

## 2016-08-08 ENCOUNTER — Encounter
Admission: RE | Admit: 2016-08-08 | Discharge: 2016-08-08 | Disposition: A | Discharge status: Home / Self Care | Payer: PPO | Source: Ambulatory Visit | Attending: Orthopedic Surgery | Admitting: Orthopedic Surgery

## 2016-08-08 DIAGNOSIS — Z01818 Encounter for other preprocedural examination: Secondary | ICD-10-CM | POA: Diagnosis not present

## 2016-08-08 HISTORY — DX: Other specified postprocedural states: R11.2

## 2016-08-08 HISTORY — DX: Other specified postprocedural states: Z98.890

## 2016-08-08 LAB — TYPE AND SCREEN
ABO/RH(D): A POS
Antibody Screen: NEGATIVE

## 2016-08-08 LAB — BASIC METABOLIC PANEL
Anion gap: 7 (ref 5–15)
BUN: 20 mg/dL (ref 6–20)
CALCIUM: 9.3 mg/dL (ref 8.9–10.3)
CHLORIDE: 102 mmol/L (ref 101–111)
CO2: 25 mmol/L (ref 22–32)
Creatinine, Ser: 1.23 mg/dL — ABNORMAL HIGH (ref 0.44–1.00)
GFR calc non Af Amer: 40 mL/min — ABNORMAL LOW (ref >60)
GFR, EST AFRICAN AMERICAN: 46 mL/min — AB (ref >60)
Glucose, Bld: 131 mg/dL — ABNORMAL HIGH (ref 65–99)
Potassium: 3.8 mmol/L (ref 3.5–5.1)
Sodium: 134 mmol/L — ABNORMAL LOW (ref 135–145)

## 2016-08-08 LAB — CBC
HCT: 36.1 % (ref 35.0–47.0)
HEMOGLOBIN: 12.4 g/dL (ref 12.0–16.0)
MCH: 31.1 pg (ref 26.0–34.0)
MCHC: 34.5 g/dL (ref 32.0–36.0)
MCV: 90.2 fL (ref 80.0–100.0)
PLATELETS: 270 10*3/uL (ref 150–440)
RBC: 4.01 MIL/uL (ref 3.80–5.20)
RDW: 13.2 % (ref 11.5–14.5)
WBC: 8.1 10*3/uL (ref 3.6–11.0)

## 2016-08-08 LAB — SURGICAL PCR SCREEN
MRSA, PCR: NEGATIVE
Staphylococcus aureus: NEGATIVE

## 2016-08-08 LAB — PROTIME-INR
INR: 1.01
Prothrombin Time: 13.3 seconds (ref 11.4–15.2)

## 2016-08-08 LAB — URINALYSIS, ROUTINE W REFLEX MICROSCOPIC
Bilirubin Urine: NEGATIVE
GLUCOSE, UA: NEGATIVE mg/dL
Hgb urine dipstick: NEGATIVE
KETONES UR: 5 mg/dL — AB
LEUKOCYTES UA: NEGATIVE
Nitrite: NEGATIVE
PROTEIN: NEGATIVE mg/dL
Specific Gravity, Urine: 1.023 (ref 1.005–1.030)
pH: 5 (ref 5.0–8.0)

## 2016-08-08 LAB — APTT: aPTT: 27 seconds (ref 24–36)

## 2016-08-08 LAB — SEDIMENTATION RATE: Sed Rate: 56 mm/hr — ABNORMAL HIGH (ref 0–30)

## 2016-08-08 NOTE — Patient Instructions (Signed)
  Your procedure is scheduled LH:[id number] August 21, 2016. Report to Same Day Surgery. To find out your arrival time please call [phone] between 1PM - 3PM on Monday August 20, 2016.  Remember: Instructions that are not followed completely may result in serious medical risk, up to and including death, or upon the discretion of your surgeon and anesthesiologist your surgery may need to be rescheduled.    _x___ 1. Do not eat food or drink liquids after midnight. No gum chewing or hard candies.     ____ 2. No Alcohol for 24 hours before or after surgery.   ____ 3. Bring all medications with you on the day of surgery if instructed.    __x__ 4. Notify your doctor if there is any change in your medical condition     (cold, fever, infections).    __x___ 5. No smoking 24 hours prior to surgery.     Do not wear jewelry, make-up, hairpins, clips or nail polish.  Do not wear lotions, powders, or perfumes.   Do not shave 48 hours prior to surgery. Men may shave face and neck.  Do not bring valuables to the hospital.    [facility] is not responsible for any belongings or valuables.               Contacts, dentures or bridgework may not be worn into surgery.  Leave your suitcase in the car. After surgery it may be brought to your room.  For patients admitted to the hospital, discharge time is determined by your treatment team.   Patients discharged the day of surgery will not be allowed to drive home.    Please read over the following fact sheets that you were given:   [facility] Preparing for Surgery  __x__ Take these medicines the morning of surgery with A SIP OF WATER:    1. citalopram (CELEXA)  2. losartan (COZAAR)     ____ Fleet Enema (as directed)   _x___ Use CHG Soap as directed on instruction sheet  ____ Use inhalers on the day of surgery and bring to hospital day of surgery  ____ Stop metformin 2 days prior to surgery    ____ Take 1/2 of usual insulin dose the night before  surgery and none on the morning of surgery.   ____ Stop Coumadin/Plavix/aspirin on does not apply.  _x___ Stop Anti-inflammatories such as Advil, Aleve, Ibuprofen, Motrin, Naproxen, Naprosyn, Goodies powders or aspirin  products. OK to take Tylenol or Tramadol.   ____ Stop supplements until after surgery.    ____ Bring C-Pap to the hospital.

## 2016-08-08 NOTE — Pre-Procedure Instructions (Signed)
EKG  ED ECG REPORT I, Doran Stabler, the attending physician, personally viewed and interpreted this ECG.   Date: 06/15/2016  EKG Time: 0934  Rate: 72  Rhythm: normal sinus rhythm with PVC times one.  Axis: normal  Intervals:none  ST&T Change: No ST segment elevation or depression. No abnormal T-wave inversion.

## 2016-08-10 LAB — URINE CULTURE

## 2016-08-13 NOTE — Pre-Procedure Instructions (Signed)
UC CALLED AND FAXED TO [facility] AT [facility]

## 2016-08-20 MED ORDER — CEFAZOLIN SODIUM-DEXTROSE 2-4 GM/100ML-% IV SOLN
2.0000 g | Freq: Once | INTRAVENOUS | Status: AC
  Administered 2016-08-21: 2 g via INTRAVENOUS

## 2016-08-21 ENCOUNTER — Inpatient Hospital Stay: Payer: PPO | Admitting: Anesthesiology

## 2016-08-21 ENCOUNTER — Inpatient Hospital Stay: Payer: PPO

## 2016-08-21 ENCOUNTER — Inpatient Hospital Stay
Admission: RE | Admit: 2016-08-21 | Discharge: 2016-08-23 | DRG: 470 | Disposition: A | Discharge status: Skilled Nursing Facility | Payer: PPO | Source: Ambulatory Visit | Attending: Orthopedic Surgery | Admitting: Orthopedic Surgery

## 2016-08-21 ENCOUNTER — Encounter
Admission: RE | Disposition: A | Discharge status: Skilled Nursing Facility | Payer: Self-pay | Source: Ambulatory Visit | Attending: Orthopedic Surgery

## 2016-08-21 ENCOUNTER — Encounter: Payer: Self-pay | Admitting: *Deleted

## 2016-08-21 DIAGNOSIS — M25561 Pain in right knee: Secondary | ICD-10-CM | POA: Diagnosis present

## 2016-08-21 DIAGNOSIS — I10 Essential (primary) hypertension: Secondary | ICD-10-CM | POA: Diagnosis present

## 2016-08-21 DIAGNOSIS — Z96652 Presence of left artificial knee joint: Secondary | ICD-10-CM | POA: Diagnosis not present

## 2016-08-21 DIAGNOSIS — Z86010 Personal history of colonic polyps: Secondary | ICD-10-CM

## 2016-08-21 DIAGNOSIS — Z79899 Other long term (current) drug therapy: Secondary | ICD-10-CM

## 2016-08-21 DIAGNOSIS — M1711 Unilateral primary osteoarthritis, right knee: Secondary | ICD-10-CM | POA: Diagnosis not present

## 2016-08-21 DIAGNOSIS — Z7401 Bed confinement status: Secondary | ICD-10-CM | POA: Diagnosis not present

## 2016-08-21 DIAGNOSIS — Z888 Allergy status to other drugs, medicaments and biological substances status: Secondary | ICD-10-CM | POA: Diagnosis not present

## 2016-08-21 DIAGNOSIS — K589 Irritable bowel syndrome without diarrhea: Secondary | ICD-10-CM | POA: Diagnosis not present

## 2016-08-21 DIAGNOSIS — G8918 Other acute postprocedural pain: Secondary | ICD-10-CM

## 2016-08-21 DIAGNOSIS — G5600 Carpal tunnel syndrome, unspecified upper limb: Secondary | ICD-10-CM | POA: Diagnosis not present

## 2016-08-21 DIAGNOSIS — Z96651 Presence of right artificial knee joint: Secondary | ICD-10-CM | POA: Diagnosis not present

## 2016-08-21 DIAGNOSIS — Z471 Aftercare following joint replacement surgery: Secondary | ICD-10-CM | POA: Diagnosis not present

## 2016-08-21 DIAGNOSIS — F329 Major depressive disorder, single episode, unspecified: Secondary | ICD-10-CM | POA: Diagnosis not present

## 2016-08-21 DIAGNOSIS — D649 Anemia, unspecified: Secondary | ICD-10-CM | POA: Diagnosis not present

## 2016-08-21 DIAGNOSIS — M25569 Pain in unspecified knee: Secondary | ICD-10-CM | POA: Diagnosis not present

## 2016-08-21 HISTORY — PX: TOTAL KNEE ARTHROPLASTY: SHX125

## 2016-08-21 LAB — CREATININE, SERUM
CREATININE: 1 mg/dL (ref 0.44–1.00)
GFR calc Af Amer: 60 mL/min — ABNORMAL LOW (ref >60)
GFR, EST NON AFRICAN AMERICAN: 51 mL/min — AB (ref >60)

## 2016-08-21 LAB — CBC
HCT: 31.9 % — ABNORMAL LOW (ref 35.0–47.0)
HEMOGLOBIN: 10.7 g/dL — AB (ref 12.0–16.0)
MCH: 30.7 pg (ref 26.0–34.0)
MCHC: 33.7 g/dL (ref 32.0–36.0)
MCV: 91 fL (ref 80.0–100.0)
Platelets: 211 10*3/uL (ref 150–440)
RBC: 3.5 MIL/uL — AB (ref 3.80–5.20)
RDW: 13 % (ref 11.5–14.5)
WBC: 7.2 10*3/uL (ref 3.6–11.0)

## 2016-08-21 LAB — ABO/RH: ABO/RH(D): A POS

## 2016-08-21 SURGERY — ARTHROPLASTY, KNEE, TOTAL
Anesthesia: General | Laterality: Right | Wound class: Clean

## 2016-08-21 MED ORDER — BUPIVACAINE HCL (PF) 0.5 % IJ SOLN
INTRAMUSCULAR | Status: DC | PRN
  Administered 2016-08-21: 3 mL

## 2016-08-21 MED ORDER — KETAMINE HCL 50 MG/ML IJ SOLN
INTRAMUSCULAR | Status: DC | PRN
  Administered 2016-08-21 (×4): 1.6 mg via INTRAMUSCULAR

## 2016-08-21 MED ORDER — CEFAZOLIN SODIUM-DEXTROSE 2-4 GM/100ML-% IV SOLN
2.0000 g | Freq: Four times a day (QID) | INTRAVENOUS | Status: AC
  Administered 2016-08-21 – 2016-08-22 (×3): 2 g via INTRAVENOUS
  Filled 2016-08-21 (×3): qty 100

## 2016-08-21 MED ORDER — VASOPRESSIN 20 UNIT/ML IV SOLN
INTRAVENOUS | Status: AC
  Filled 2016-08-21: qty 1

## 2016-08-21 MED ORDER — ACETAMINOPHEN 325 MG PO TABS: 650.0000 mg | ORAL_TABLET | Freq: Four times a day (QID) | ORAL | Status: DC | PRN

## 2016-08-21 MED ORDER — GLYCOPYRROLATE 0.2 MG/ML IJ SOLN
INTRAMUSCULAR | Status: AC
  Filled 2016-08-21: qty 1

## 2016-08-21 MED ORDER — ONDANSETRON HCL 4 MG/2ML IJ SOLN: 4.0000 mg | Freq: Once | INTRAMUSCULAR | Status: DC | PRN

## 2016-08-21 MED ORDER — HYDROCHLOROTHIAZIDE 25 MG PO TABS
25.0000 mg | ORAL_TABLET | Freq: Every day | ORAL | Status: DC
  Administered 2016-08-22 – 2016-08-23 (×2): 25 mg via ORAL
  Filled 2016-08-21 (×2): qty 1

## 2016-08-21 MED ORDER — PROPOFOL 10 MG/ML IV BOLUS
INTRAVENOUS | Status: DC | PRN
  Administered 2016-08-21 (×4): 16 mg via INTRAVENOUS

## 2016-08-21 MED ORDER — BUPIVACAINE-EPINEPHRINE (PF) 0.25% -1:200000 IJ SOLN
INTRAMUSCULAR | Status: AC
  Filled 2016-08-21: qty 30

## 2016-08-21 MED ORDER — BUPIVACAINE LIPOSOME 1.3 % IJ SUSP
INTRAMUSCULAR | Status: AC
  Filled 2016-08-21: qty 20

## 2016-08-21 MED ORDER — NEOMYCIN-POLYMYXIN B GU 40-200000 IR SOLN
Status: AC
  Filled 2016-08-21: qty 20

## 2016-08-21 MED ORDER — MAGNESIUM HYDROXIDE 400 MG/5ML PO SUSP
30.0000 mL | Freq: Every day | ORAL | Status: DC | PRN
  Administered 2016-08-22: 30 mL via ORAL
  Filled 2016-08-21: qty 30

## 2016-08-21 MED ORDER — FENTANYL CITRATE (PF) 100 MCG/2ML IJ SOLN
INTRAMUSCULAR | Status: DC | PRN
  Administered 2016-08-21: 100 ug via INTRAVENOUS

## 2016-08-21 MED ORDER — METHOCARBAMOL 1000 MG/10ML IJ SOLN
500.0000 mg | Freq: Four times a day (QID) | INTRAVENOUS | Status: DC | PRN
  Filled 2016-08-21: qty 5

## 2016-08-21 MED ORDER — GLYCOPYRROLATE 0.2 MG/ML IJ SOLN
INTRAMUSCULAR | Status: DC | PRN
  Administered 2016-08-21: 0.2 mg via INTRAVENOUS

## 2016-08-21 MED ORDER — ACETAMINOPHEN 10 MG/ML IV SOLN
INTRAVENOUS | Status: DC | PRN
  Administered 2016-08-21: 1000 mg via INTRAVENOUS

## 2016-08-21 MED ORDER — ACETAMINOPHEN 10 MG/ML IV SOLN
INTRAVENOUS | Status: AC
  Filled 2016-08-21: qty 100

## 2016-08-21 MED ORDER — FENTANYL CITRATE (PF) 100 MCG/2ML IJ SOLN
INTRAMUSCULAR | Status: AC
  Filled 2016-08-21: qty 2

## 2016-08-21 MED ORDER — METOCLOPRAMIDE HCL 5 MG/ML IJ SOLN: 5.0000 mg | Freq: Three times a day (TID) | INTRAMUSCULAR | Status: DC | PRN

## 2016-08-21 MED ORDER — ACETAMINOPHEN 650 MG RE SUPP: 650.0000 mg | Freq: Four times a day (QID) | RECTAL | Status: DC | PRN

## 2016-08-21 MED ORDER — MESALAMINE 1.2 G PO TBEC
4.8000 g | DELAYED_RELEASE_TABLET | Freq: Every day | ORAL | Status: DC
  Administered 2016-08-22 – 2016-08-23 (×2): 4.8 g via ORAL
  Filled 2016-08-21 (×2): qty 4

## 2016-08-21 MED ORDER — ENOXAPARIN SODIUM 30 MG/0.3ML ~~LOC~~ SOLN
30.0000 mg | Freq: Two times a day (BID) | SUBCUTANEOUS | Status: DC
  Administered 2016-08-22 – 2016-08-23 (×4): 30 mg via SUBCUTANEOUS
  Filled 2016-08-21 (×4): qty 0.3

## 2016-08-21 MED ORDER — PROPOFOL 500 MG/50ML IV EMUL
INTRAVENOUS | Status: AC
  Filled 2016-08-21: qty 50

## 2016-08-21 MED ORDER — ONDANSETRON HCL 4 MG PO TABS
4.0000 mg | ORAL_TABLET | Freq: Four times a day (QID) | ORAL | Status: DC | PRN
  Administered 2016-08-23: 4 mg via ORAL
  Filled 2016-08-21: qty 1

## 2016-08-21 MED ORDER — SODIUM CHLORIDE 0.9 % IV SOLN
INTRAVENOUS | Status: DC
  Administered 2016-08-21 – 2016-08-22 (×2): via INTRAVENOUS

## 2016-08-21 MED ORDER — BUPIVACAINE-EPINEPHRINE (PF) 0.25% -1:200000 IJ SOLN
INTRAMUSCULAR | Status: DC | PRN
  Administered 2016-08-21: 30 mL

## 2016-08-21 MED ORDER — MAGNESIUM CITRATE PO SOLN
1.0000 | Freq: Once | ORAL | Status: DC | PRN
  Filled 2016-08-21: qty 296

## 2016-08-21 MED ORDER — ONDANSETRON HCL 4 MG/2ML IJ SOLN
4.0000 mg | Freq: Four times a day (QID) | INTRAMUSCULAR | Status: DC | PRN
Start: 2016-08-21 — End: 2016-08-24

## 2016-08-21 MED ORDER — DIPHENHYDRAMINE HCL 12.5 MG/5ML PO ELIX: 12.5000 mg | ORAL_SOLUTION | ORAL | Status: DC | PRN

## 2016-08-21 MED ORDER — LACTATED RINGERS IV SOLN
INTRAVENOUS | Status: DC
  Administered 2016-08-21 (×2): via INTRAVENOUS

## 2016-08-21 MED ORDER — LOSARTAN POTASSIUM 50 MG PO TABS
100.0000 mg | ORAL_TABLET | Freq: Every day | ORAL | Status: DC
  Administered 2016-08-22 – 2016-08-23 (×2): 100 mg via ORAL
  Filled 2016-08-21 (×2): qty 2

## 2016-08-21 MED ORDER — SODIUM CHLORIDE 0.9 % IV SOLN
INTRAVENOUS | Status: DC | PRN
  Administered 2016-08-21: 4 ug/kg/min via INTRAVENOUS

## 2016-08-21 MED ORDER — SODIUM CHLORIDE 0.9 % IV SOLN
INTRAVENOUS | Status: DC | PRN
  Administered 2016-08-21: 20 ug/min via INTRAVENOUS
  Administered 2016-08-21: 30 ug/min via INTRAVENOUS

## 2016-08-21 MED ORDER — SODIUM CHLORIDE 0.9 % IJ SOLN
INTRAMUSCULAR | Status: AC
Start: 2016-08-21 — End: ?
  Filled 2016-08-21: qty 150

## 2016-08-21 MED ORDER — FAMOTIDINE 20 MG PO TABS
20.0000 mg | ORAL_TABLET | Freq: Once | ORAL | Status: AC
Start: 2016-08-21 — End: 2016-08-21
  Administered 2016-08-21: 20 mg via ORAL

## 2016-08-21 MED ORDER — DEXAMETHASONE SODIUM PHOSPHATE 4 MG/ML IJ SOLN
INTRAMUSCULAR | Status: DC | PRN
  Administered 2016-08-21: 5 mg via INTRAVENOUS

## 2016-08-21 MED ORDER — CITALOPRAM HYDROBROMIDE 20 MG PO TABS
30.0000 mg | ORAL_TABLET | Freq: Every day | ORAL | Status: DC
  Administered 2016-08-21 – 2016-08-23 (×3): 30 mg via ORAL
  Filled 2016-08-21 (×3): qty 2

## 2016-08-21 MED ORDER — MORPHINE SULFATE (PF) 2 MG/ML IV SOLN
2.0000 mg | INTRAVENOUS | Status: DC | PRN
  Administered 2016-08-21 (×2): 2 mg via INTRAVENOUS
  Filled 2016-08-21 (×2): qty 1

## 2016-08-21 MED ORDER — ZOLPIDEM TARTRATE 5 MG PO TABS: 5.0000 mg | ORAL_TABLET | Freq: Every evening | ORAL | Status: DC | PRN

## 2016-08-21 MED ORDER — PROPOFOL 500 MG/50ML IV EMUL
INTRAVENOUS | Status: DC | PRN
  Administered 2016-08-21: 40 ug/kg/min via INTRAVENOUS

## 2016-08-21 MED ORDER — PHENOL 1.4 % MT LIQD
1.0000 | OROMUCOSAL | Status: DC | PRN
  Filled 2016-08-21: qty 177

## 2016-08-21 MED ORDER — LIDOCAINE HCL (PF) 2 % IJ SOLN
INTRAMUSCULAR | Status: AC
  Filled 2016-08-21: qty 2

## 2016-08-21 MED ORDER — BISACODYL 10 MG RE SUPP: 10.0000 mg | Freq: Every day | RECTAL | Status: DC | PRN

## 2016-08-21 MED ORDER — METHOCARBAMOL 500 MG PO TABS
500.0000 mg | ORAL_TABLET | Freq: Four times a day (QID) | ORAL | Status: DC | PRN
  Administered 2016-08-21 – 2016-08-23 (×6): 500 mg via ORAL
  Filled 2016-08-21 (×6): qty 1

## 2016-08-21 MED ORDER — DOCUSATE SODIUM 100 MG PO CAPS
100.0000 mg | ORAL_CAPSULE | Freq: Two times a day (BID) | ORAL | Status: DC
Start: 2016-08-21 — End: 2016-08-24
  Administered 2016-08-22 – 2016-08-23 (×4): 100 mg via ORAL
  Filled 2016-08-21 (×5): qty 1

## 2016-08-21 MED ORDER — METOCLOPRAMIDE HCL 10 MG PO TABS: 5.0000 mg | ORAL_TABLET | Freq: Three times a day (TID) | ORAL | Status: DC | PRN

## 2016-08-21 MED ORDER — CYCLOBENZAPRINE HCL 10 MG PO TABS: 10.0000 mg | ORAL_TABLET | Freq: Three times a day (TID) | ORAL | Status: DC | PRN

## 2016-08-21 MED ORDER — BUPIVACAINE LIPOSOME 1.3 % IJ SUSP
INTRAMUSCULAR | Status: DC | PRN
  Administered 2016-08-21: 70 mL

## 2016-08-21 MED ORDER — FENTANYL CITRATE (PF) 100 MCG/2ML IJ SOLN: 25.0000 ug | INTRAMUSCULAR | Status: DC | PRN

## 2016-08-21 MED ORDER — OXYCODONE HCL 5 MG PO TABS
5.0000 mg | ORAL_TABLET | ORAL | Status: DC | PRN
  Administered 2016-08-21 (×2): 5 mg via ORAL
  Administered 2016-08-21 – 2016-08-23 (×8): 10 mg via ORAL
  Administered 2016-08-23: 5 mg via ORAL
  Filled 2016-08-21 (×2): qty 2
  Filled 2016-08-21: qty 1
  Filled 2016-08-21 (×3): qty 2
  Filled 2016-08-21: qty 1
  Filled 2016-08-21 (×2): qty 2
  Filled 2016-08-21: qty 1
  Filled 2016-08-21: qty 2

## 2016-08-21 MED ORDER — MORPHINE SULFATE 10 MG/ML IJ SOLN
INTRAMUSCULAR | Status: DC | PRN
  Administered 2016-08-21: 10 mg via INTRAMUSCULAR

## 2016-08-21 MED ORDER — MENTHOL 3 MG MT LOZG
1.0000 | LOZENGE | OROMUCOSAL | Status: DC | PRN
  Filled 2016-08-21: qty 9

## 2016-08-21 SURGICAL SUPPLY — 60 items
BANDAGE ACE 6X5 VEL STRL LF (GAUZE/BANDAGES/DRESSINGS) ×3 IMPLANT
BLADE SAW 1 (BLADE) ×3 IMPLANT
BLOCK CUTTING FEMUR 4 RT (MISCELLANEOUS) IMPLANT
BLOCK CUTTING TIBIAL 4 RT MED (MISCELLANEOUS) IMPLANT
CANISTER SUCT 1200ML W/VALVE (MISCELLANEOUS) ×3 IMPLANT
CANISTER SUCT 3000ML PPV (MISCELLANEOUS) ×6 IMPLANT
CAPT KNEE TOTAL 3 ×3 IMPLANT
CATH FOL LEG HOLDER (MISCELLANEOUS) ×3 IMPLANT
CATH TRAY METER 16FR LF (MISCELLANEOUS) ×3 IMPLANT
CEMENT HV SMART SET (Cement) ×6 IMPLANT
CHLORAPREP W/TINT 26ML (MISCELLANEOUS) ×6 IMPLANT
COOLER POLAR GLACIER W/PUMP (MISCELLANEOUS) ×3 IMPLANT
CUFF TOURN 24 STER (MISCELLANEOUS) IMPLANT
CUFF TOURN 30 STER DUAL PORT (MISCELLANEOUS) IMPLANT
DRAPE SHEET LG 3/4 BI-LAMINATE (DRAPES) ×6 IMPLANT
ELECT CAUTERY BLADE 6.4 (BLADE) ×3 IMPLANT
ELECT REM PT RETURN 9FT ADLT (ELECTROSURGICAL) ×3
ELECTRODE REM PT RTRN 9FT ADLT (ELECTROSURGICAL) ×1 IMPLANT
GAUZE PETRO XEROFOAM 1X8 (MISCELLANEOUS) ×3 IMPLANT
GAUZE SPONGE 4X4 12PLY STRL (GAUZE/BANDAGES/DRESSINGS) ×3 IMPLANT
GLOVE BIOGEL PI IND STRL 9 (GLOVE) ×1 IMPLANT
GLOVE BIOGEL PI INDICATOR 9 (GLOVE) ×2
GLOVE INDICATOR 8.0 STRL GRN (GLOVE) ×3 IMPLANT
GLOVE SURG ORTHO 8.0 STRL STRW (GLOVE) ×3 IMPLANT
GLOVE SURG SYN 9.0  PF PI (GLOVE) ×2
GLOVE SURG SYN 9.0 PF PI (GLOVE) ×1 IMPLANT
GOWN SRG 2XL LVL 4 RGLN SLV (GOWNS) ×1 IMPLANT
GOWN STRL NON-REIN 2XL LVL4 (GOWNS) ×2
GOWN STRL REUS W/ TWL LRG LVL3 (GOWN DISPOSABLE) ×1 IMPLANT
GOWN STRL REUS W/ TWL XL LVL3 (GOWN DISPOSABLE) ×1 IMPLANT
GOWN STRL REUS W/TWL LRG LVL3 (GOWN DISPOSABLE) ×2
GOWN STRL REUS W/TWL XL LVL3 (GOWN DISPOSABLE) ×2
HOOD PEEL AWAY FLYTE STAYCOOL (MISCELLANEOUS) ×6 IMPLANT
IMMBOLIZER KNEE 19 BLUE UNIV (SOFTGOODS) ×3 IMPLANT
KIT PREVENA INCISION MGT 13 (CANNISTER) ×3 IMPLANT
KIT RM TURNOVER STRD PROC AR (KITS) ×3 IMPLANT
KNEE MEDACTA TIBIAL/FEMORAL BL (Knees) ×3 IMPLANT
KNIFE SCULPS 14X20 (INSTRUMENTS) ×3 IMPLANT
NDL SAFETY 18GX1.5 (NEEDLE) ×3 IMPLANT
NEEDLE SPNL 18GX3.5 QUINCKE PK (NEEDLE) ×3 IMPLANT
NEEDLE SPNL 20GX3.5 QUINCKE YW (NEEDLE) ×3 IMPLANT
NS IRRIG 1000ML POUR BTL (IV SOLUTION) ×3 IMPLANT
PACK TOTAL KNEE (MISCELLANEOUS) ×3 IMPLANT
PAD WRAPON POLAR KNEE (MISCELLANEOUS) ×1 IMPLANT
PULSAVAC PLUS IRRIG FAN TIP (DISPOSABLE) ×3
SOL .9 NS 3000ML IRR  AL (IV SOLUTION) ×2
SOL .9 NS 3000ML IRR UROMATIC (IV SOLUTION) ×1 IMPLANT
STAPLER SKIN PROX 35W (STAPLE) ×3 IMPLANT
SUCTION FRAZIER HANDLE 10FR (MISCELLANEOUS) ×2
SUCTION TUBE FRAZIER 10FR DISP (MISCELLANEOUS) ×1 IMPLANT
SUT DVC 2 QUILL PDO  T11 36X36 (SUTURE) ×2
SUT DVC 2 QUILL PDO T11 36X36 (SUTURE) ×1 IMPLANT
SUT ETHIBOND NAB CT1 #1 30IN (SUTURE) ×3 IMPLANT
SUT V-LOC 90 ABS DVC 3-0 CL (SUTURE) ×3 IMPLANT
SYR 20CC LL (SYRINGE) ×3 IMPLANT
SYR 50ML LL SCALE MARK (SYRINGE) ×6 IMPLANT
TIP FAN IRRIG PULSAVAC PLUS (DISPOSABLE) ×1 IMPLANT
TOWEL OR 17X26 4PK STRL BLUE (TOWEL DISPOSABLE) ×3 IMPLANT
TOWER CARTRIDGE SMART MIX (DISPOSABLE) ×3 IMPLANT
WRAPON POLAR PAD KNEE (MISCELLANEOUS) ×3

## 2016-08-21 NOTE — NC FL2 (Signed)
[place] LEVEL OF CARE SCREENING TOOL     IDENTIFICATION  Patient Name: Paula Powell Birthdate: [birth year] Sex: female Admission Date (Current Location): 08/21/2016  [place] and [place] Number:  Engineering geologist and Address:  [facility], [address], [place], [place] [address]      Provider Number: [id number]  Attending Physician Name and Address:  Hessie Knows, MD  Relative Name and Phone Number:       Current Level of Care: Hospital Recommended Level of Care: [place] Prior Approval Number:    Date Approved/Denied:   PASRR Number:  [id number])  Discharge Plan: SNF    Current Diagnoses: Patient Active Problem List   Diagnosis Date Noted  . Primary osteoarthritis of right knee 08/21/2016    Orientation RESPIRATION BLADDER Height & Weight     Self, Time, Situation, Place  Normal Continent Weight: 188 lb (85.3 kg) Height:  5\' 6"  (167.6 cm)  BEHAVIORAL SYMPTOMS/MOOD NEUROLOGICAL BOWEL NUTRITION STATUS   (none)  (none) Continent Diet (Regular Diet )  AMBULATORY STATUS COMMUNICATION OF NEEDS Skin   Extensive Assist Verbally Surgical wounds, Wound Vac (Incision: Right Knee, provena wound vac. )                       Personal Care Assistance Level of Assistance  Bathing, Feeding, Dressing Bathing Assistance: Limited assistance Feeding assistance: Independent Dressing Assistance: Limited assistance     Functional Limitations Info  Sight, Hearing, Speech Sight Info: Adequate Hearing Info: Adequate Speech Info: Adequate    SPECIAL CARE FACTORS FREQUENCY  PT (By licensed PT), OT (By licensed OT)     PT Frequency:  (5) OT Frequency:  (5)            Contractures      Additional Factors Info  Code Status, Allergies Code Status Info:  (Full Code. ) Allergies Info:  (Ace Inhibitors)           Current Medications (08/21/2016):  This is the current hospital active medication  list Current Facility-Administered Medications  Medication Dose Route Frequency Provider Last Rate Last Dose  . 0.9 %  sodium chloride infusion   Intravenous Continuous Hessie Knows, MD      . acetaminophen (TYLENOL) tablet 650 mg  650 mg Oral Q6H PRN Hessie Knows, MD       Or  . acetaminophen (TYLENOL) suppository 650 mg  650 mg Rectal Q6H PRN Hessie Knows, MD      . bisacodyl (DULCOLAX) suppository 10 mg  10 mg Rectal Daily PRN Hessie Knows, MD      . ceFAZolin (ANCEF) IVPB 2g/100 mL premix  2 g Intravenous Q6H Hessie Knows, MD      . citalopram (CELEXA) tablet 30 mg  30 mg Oral Daily Hessie Knows, MD      . diphenhydrAMINE (BENADRYL) 12.5 MG/5ML elixir 12.5-25 mg  12.5-25 mg Oral Q4H PRN Hessie Knows, MD      . docusate sodium (COLACE) capsule 100 mg  100 mg Oral BID Hessie Knows, MD      . Derrill Memo ON 08/22/2016] enoxaparin (LOVENOX) injection 30 mg  30 mg Subcutaneous Q12H Hessie Knows, MD      . Derrill Memo ON 08/22/2016] hydrochlorothiazide (HYDRODIURIL) tablet 25 mg  25 mg Oral Daily Hessie Knows, MD      . Derrill Memo ON 08/22/2016] losartan (COZAAR) tablet 100 mg  100 mg Oral Daily Hessie Knows, MD      .  magnesium citrate solution 1 Bottle  1 Bottle Oral Once PRN Hessie Knows, MD      . magnesium hydroxide (MILK OF MAGNESIA) suspension 30 mL  30 mL Oral Daily PRN Hessie Knows, MD      . menthol-cetylpyridinium (CEPACOL) lozenge 3 mg  1 lozenge Oral PRN Hessie Knows, MD       Or  . phenol (CHLORASEPTIC) mouth spray 1 spray  1 spray Mouth/Throat PRN Hessie Knows, MD      . Derrill Memo ON 08/22/2016] mesalamine (LIALDA) EC tablet 4.8 g  4.8 g Oral Q breakfast Hessie Knows, MD      . methocarbamol (ROBAXIN) tablet 500 mg  500 mg Oral Q6H PRN Hessie Knows, MD       Or  . methocarbamol (ROBAXIN) 500 mg in dextrose 5 % 50 mL IVPB  500 mg Intravenous Q6H PRN Hessie Knows, MD      . metoCLOPramide (REGLAN) tablet 5-10 mg  5-10 mg Oral Q8H PRN Hessie Knows, MD       Or  . metoCLOPramide  (REGLAN) injection 5-10 mg  5-10 mg Intravenous Q8H PRN Hessie Knows, MD      . morphine 2 MG/ML injection 2 mg  2 mg Intravenous Q1H PRN Hessie Knows, MD      . ondansetron [facility]) tablet 4 mg  4 mg Oral Q6H PRN Hessie Knows, MD       Or  . ondansetron [facility]) injection 4 mg  4 mg Intravenous Q6H PRN Hessie Knows, MD      . oxyCODONE (Oxy IR/ROXICODONE) immediate release tablet 5-10 mg  5-10 mg Oral Q3H PRN Hessie Knows, MD      . zolpidem (AMBIEN) tablet 5 mg  5 mg Oral QHS PRN Hessie Knows, MD         Discharge Medications: Please see discharge summary for a list of discharge medications.  Relevant Imaging Results:  Relevant Lab Results:   Additional Information  (SSN: [id number])  Esvin Hnat, Veronia Beets, LCSW

## 2016-08-21 NOTE — Anesthesia Post-op Follow-up Note (Cosign Needed)
Anesthesia QCDR form completed.        

## 2016-08-21 NOTE — Anesthesia Procedure Notes (Signed)
Spinal  Patient location during procedure: OR Start time: 08/21/2016 10:48 AM End time: 08/21/2016 10:58 AM Staffing Performed: resident/CRNA  Preanesthetic Checklist Completed: patient identified, site marked, surgical consent, pre-op evaluation, timeout performed, IV checked, risks and benefits discussed and monitors and equipment checked Spinal Block Patient position: sitting Prep: ChloraPrep Patient monitoring: heart rate, continuous pulse ox, blood pressure and cardiac monitor Approach: right paramedian Location: L3-4 Injection technique: single-shot Needle Needle type: [place]  Needle gauge: 22 G Needle length: 9 cm Additional Notes Negative paresthesia. Negative blood return. Positive free-flowing CSF. Expiration date of kit checked and confirmed. Patient tolerated procedure well, without complications. Epi wash.

## 2016-08-21 NOTE — Transfer of Care (Signed)
Immediate Anesthesia Transfer of Care Note  Patient: Paula Powell  Procedure(s) Performed: Procedure(s): TOTAL KNEE ARTHROPLASTY (Right)  Patient Location: PACU  Anesthesia Type:Spinal  Level of Consciousness: awake, alert , oriented and patient cooperative  Airway & Oxygen Therapy: Patient Spontanous Breathing and Patient connected to nasal cannula oxygen  Post-op Assessment: Report given to RN and Post -op Vital signs reviewed and stable  Post vital signs: Reviewed and stable  Last Vitals:  Vitals:   08/21/16 0902  BP: (!) 159/88  Pulse: 72  Resp: 16  Temp: (!) 35.9 C    Last Pain:  Vitals:   08/21/16 0902  TempSrc: Tympanic  PainSc: 3          Complications: No apparent anesthesia complications

## 2016-08-21 NOTE — Progress Notes (Signed)
PT Attempt Note  Patient Details Name: Paula Powell MRN: [id number] DOB: 03/23/1932   Evaluation Attempt:    Reason Eval/Treat Not Completed: Medical issues which prohibited therapy. Attempted PT evaluation however pt still has no motor function or sensation. Will perform PT evaluation on later date as pt is appropriate.   Lyndel Safe Paula Powell PT, DPT Paula Powell,Paula Powell 08/21/2016, 3:00 PM

## 2016-08-21 NOTE — Care Management (Signed)
RNCM consult received for discharge planning. POD # 0 right TKA. Patient presents from home with her daughter, granddaughter and great granddaughter. Will follow progression. PT pending.

## 2016-08-21 NOTE — Anesthesia Preprocedure Evaluation (Addendum)
Anesthesia Evaluation  Patient identified by MRN, date of birth, ID band Patient awake    Reviewed: Allergy & Precautions, NPO status , Patient's Chart, lab work & pertinent test results  History of Anesthesia Complications (+) PONV and history of anesthetic complications  Airway Mallampati: II  TM Distance: >3 FB Neck ROM: Full    Dental  (+) Upper Dentures, Partial Lower   Pulmonary neg pulmonary ROS, Current Smoker,    Pulmonary exam normal breath sounds clear to auscultation       Cardiovascular hypertension, Pt. on medications + Peripheral Vascular Disease  Normal cardiovascular exam+ Valvular Problems/Murmurs  Rhythm:Regular Rate:Normal     Neuro/Psych PSYCHIATRIC DISORDERS Anxiety Depression negative neurological ROS     GI/Hepatic Neg liver ROS, IBS, diverticulosis   Endo/Other  negative endocrine ROS  Renal/GU negative Renal ROS  negative genitourinary   Musculoskeletal  (+) Arthritis , Osteoarthritis,    Abdominal   Peds negative pediatric ROS (+)  Hematology  (+) anemia ,   Anesthesia Other Findings   Reproductive/Obstetrics negative OB ROS                             Anesthesia Physical  Anesthesia Plan  ASA: II  Anesthesia Plan: Spinal   Post-op Pain Management:    Induction: Intravenous  PONV Risk Score and Plan:   Airway Management Planned: Nasal Cannula  Additional Equipment:   Intra-op Plan:   Post-operative Plan:   Informed Consent: I have reviewed the patients History and Physical, chart, labs and discussed the procedure including the risks, benefits and alternatives for the proposed anesthesia with the patient or authorized representative who has indicated his/her understanding and acceptance.   Dental advisory given  Plan Discussed with: CRNA and Surgeon  Anesthesia Plan Comments:       Anesthesia Quick Evaluation

## 2016-08-21 NOTE — H&P (Signed)
Reviewed paper H+P, will be scanned into chart. patient examnied No changes noted.

## 2016-08-21 NOTE — Op Note (Signed)
08/21/2016  1:17 PM  PATIENT:  Paula Powell  83 y.o. female  PRE-OPERATIVE DIAGNOSIS:  PRIMARY OSTEOARTHRITIS OF RIGHT KNEE  POST-OPERATIVE DIAGNOSIS:  PRIMARY OSTEOARTHRITIS OF RIGHT KNEE  PROCEDURE:  Procedure(s): TOTAL KNEE ARTHROPLASTY (Right)  SURGEON: Laurene Footman, MD  ASSISTANTS: Rachelle Hora [facility]  ANESTHESIA:   spinal  EBL:  Total I/O In: 4128 [I.V.:1450] Out: 440 [Urine:240; Blood:200]  BLOOD ADMINISTERED:none  DRAINS: none   LOCAL MEDICATIONS USED:  MARCAINE    and OTHER morphine and exparel  SPECIMEN:  No Specimen  DISPOSITION OF SPECIMEN:  N/A  COUNTS:  YES  TOURNIQUET:   26 minutes at 300 mmHg  IMPLANTS: Medacta GMK 4 right femur for right tibia with a 14 mm PS insert size 4 11 mm x 30 mm stem and size 2 patella all components cemented  DICTATION: .Dragon Dictation patient brought the operating room and after adequate spinal anesthesia was obtained, the right leg was prepped and draped in sterile fashion. After appropriate patient identification and timeout procedure completed a midline skin incision was made followed by medial parapatellar arthrotomy. Fat pad and anterior cruciate ligament excised. Approximately tibia exposure was obtained to get the Medacta cutting block applied from the my knee system and proximal tibia cut was carried out. The distal femoral cut was carried out a similar fashion using the my knee guide and the 4 in 1 cutting guide was applied to the distal femur anterior posterior chamfer cuts performed. At this point tibial baseplate was trialed proximal tibial preparation performed and keel punch placed followed by the 4 femur trial and a 12 mm insert was somewhat unstable medially but there was tight lateral so the femoral trial was removed and lateral releases carried out following this the 14 mm insert gave good stability in flexion and extension. With the femoral trial in place. Trial components removed and after doing the trochlear  preparation and PS drill. All trials removed and the patella was cut using a  guide and sized to size 2 after drilling holes were made. At this point a lateral release was performed because of her valgus knee this is going to be needed anyways and tourniquet was raised with local anesthetic infiltrated the bony surfaces were thoroughly irrigated and dried and all components were cemented into place with the polyethylene insert placed with set screw tightened with a torque screwdriver. After the cemented set excess cement was removed and the knee again thoroughly irrigated. The arthrotomy was then closed after letting down the tourniquet with Ethibond medially initially to help maintain patellar alignment with the lateral release followed by a heavy Quill for the capsule TXA was then injected into the directly into the joint. 3-0 v-loc was used subcutaneously followed by skin staples and covered with an incisional wound VAC.  PLAN OF CARE: Admit to inpatient   PATIENT DISPOSITION:  PACU - hemodynamically stable.

## 2016-08-22 LAB — CBC
HEMATOCRIT: 33.5 % — AB (ref 35.0–47.0)
HEMOGLOBIN: 11.2 g/dL — AB (ref 12.0–16.0)
MCH: 31.1 pg (ref 26.0–34.0)
MCHC: 33.6 g/dL (ref 32.0–36.0)
MCV: 92.6 fL (ref 80.0–100.0)
Platelets: 223 10*3/uL (ref 150–440)
RBC: 3.62 MIL/uL — ABNORMAL LOW (ref 3.80–5.20)
RDW: 13.2 % (ref 11.5–14.5)
WBC: 7.4 10*3/uL (ref 3.6–11.0)

## 2016-08-22 LAB — BASIC METABOLIC PANEL
ANION GAP: 8 (ref 5–15)
BUN: 13 mg/dL (ref 6–20)
CALCIUM: 8.8 mg/dL — AB (ref 8.9–10.3)
CO2: 25 mmol/L (ref 22–32)
CREATININE: 0.75 mg/dL (ref 0.44–1.00)
Chloride: 104 mmol/L (ref 101–111)
GFR calc non Af Amer: >60 mL/min (ref >60)
Glucose, Bld: 113 mg/dL — ABNORMAL HIGH (ref 65–99)
Potassium: 4 mmol/L (ref 3.5–5.1)
SODIUM: 137 mmol/L (ref 135–145)

## 2016-08-22 MED ORDER — METOPROLOL TARTRATE 25 MG PO TABS
25.0000 mg | ORAL_TABLET | Freq: Once | ORAL | Status: AC
  Administered 2016-08-22: 25 mg via ORAL
  Filled 2016-08-22: qty 1

## 2016-08-22 NOTE — Evaluation (Signed)
Physical Therapy Evaluation Patient Details Name: Paula Powell MRN: [id number] DOB: 03/26/1932 Today's Date: 08/22/2016   History of Present Illness  Pt underwent R TKR without reported post-op complications. Attempted PT evlaution on POD#0 but sensory and motor still impaired following surgery. PT evaluation performed on POD#1. PMH includes depression, hypertension, IBS, and PVD  Clinical Impression  Pt admitted with above diagnosis. Pt currently with functional limitations due to the deficits listed below (see PT Problem List).  Pt with significant pain with all motion of RLE. However she demonstrates good motivation for therapy and is able to complete all bed exercises as instructed. She requires minA+1 for bed mobility, transfers, and ambulation. She is able to ambulate partially to door and back to recliner. She moves very slowly with decreased weight acceptance to RLE and heavy UE support. Pt has one stumble and has to reach out to grab bed railing and therapist has to assist to stabilize. Pain increases with ambulation. At this time recommendation is for SNF placement at discharge. Will continue to work toward improving mobility in order to upgrade discharge plan. Pt will benefit from PT services to address deficits in strength, balance, and mobility in order to return to full function at home.     Follow Up Recommendations SNF    Equipment Recommendations  None recommended by PT;Other (comment) (Use RW at discharge)    Recommendations for Other Services OT consult     Precautions / Restrictions Precautions Precautions: Fall;Knee Precaution Booklet Issued: Yes (comment) Required Braces or Orthoses: Knee Immobilizer - Right Knee Immobilizer - Right: Discontinue once straight leg raise with < 10 degree lag Restrictions Weight Bearing Restrictions: Yes RLE Weight Bearing: Weight bearing as tolerated      Mobility  Bed Mobility Overal bed mobility: Needs Assistance Bed Mobility:  Supine to Sit     Supine to sit: Min assist     General bed mobility comments: Pt requires minA+1 to manage RLE. Cues for hand placement and sequencing  Transfers Overall transfer level: Needs assistance Equipment used: Rolling walker (2 wheeled) Transfers: Sit to/from Stand Sit to Stand: Min assist         General transfer comment: Pt requires 2 attempts to come to standing. Increased time and decreased weight shift to RLE. Cues for safe hand placement. Once upright pt is steady and stable in standing with UE support on rolling walker  Ambulation/Gait Ambulation/Gait assistance: Min assist Ambulation Distance (Feet): 20 Feet Assistive device: Rolling walker (2 wheeled) Gait Pattern/deviations: Step-to pattern;Decreased stance time - right;Decreased weight shift to right Gait velocity: Decreased Gait velocity interpretation: <1.8 ft/sec, indicative of risk for recurrent falls General Gait Details: Pt able to ambulate partially to door and back to recliner. She moves very slowly with decreased weight acceptance to RLE and heavy UE support. Pt has one stumble and has to reach out to grab bed and therapist has to assist to stabilize. Pain increases with ambulation  Stairs            Wheelchair Mobility    Modified Rankin (Stroke Patients Only)       Balance Overall balance assessment: Needs assistance Sitting-balance support: No upper extremity supported Sitting balance-Leahy Scale: Good     Standing balance support: Bilateral upper extremity supported Standing balance-Leahy Scale: Fair Standing balance comment: Requires UE support on walker to stabilize.  Pertinent Vitals/Pain Pain Assessment: 0-10 Pain Score: 6  Pain Location: R knee pain, increases to 9/10 with movement Pain Descriptors / Indicators: Operative site guarding Pain Intervention(s): Monitored during session;Premedicated before session    Home Living  Family/patient expects to be discharged to:: Skilled nursing facility Living Arrangements: Other relatives;Children;Other (Comment) (Lives with dtr, grandaughter, and great-granddaughter) Available Help at Discharge: Family Type of Home: House Home Access: Stairs to enter Entrance Stairs-Rails:  (Grab bar to hold) Entrance Stairs-Number of Steps: 2 Home Layout: One level Home Equipment: [place] - 2 wheels;Walker - standard;Cane - single point;Bedside commode (no shower seat, no grab bars)      Prior Function Level of Independence: Needs assistance   Gait / Transfers Assistance Needed: Pt ambulates limited distances without assistive device. 1 fall in the last 12 months  ADL's / Homemaking Assistance Needed: Independent with ADLs, assist with IADLs        Hand Dominance   Dominant Hand: Right    Extremity/Trunk Assessment   Upper Extremity Assessment Upper Extremity Assessment: Overall WFL for tasks assessed    Lower Extremity Assessment Lower Extremity Assessment: RLE deficits/detail RLE Deficits / Details: Reports intact sensation throughout RLE. Able to perform SLR and SAQ without assist but with considerable increase in pain. LLE strength is grossly WFL       Communication   Communication: No difficulties  Cognition Arousal/Alertness: Awake/alert Behavior During Therapy: WFL for tasks assessed/performed Overall Cognitive Status: Within Functional Limits for tasks assessed                                        General Comments      Exercises Total Joint Exercises Ankle Circles/Pumps: AROM;Both;10 reps;Supine Quad Sets: Strengthening;Both;10 reps;Supine Gluteal Sets: Strengthening;Both;10 reps;Supine Towel Squeeze: Strengthening;Both;10 reps;Supine Short Arc Quad: Strengthening;Right;10 reps;Supine Heel Slides: Strengthening;Right;10 reps;Supine Hip ABduction/ADduction: Strengthening;Right;10 reps;Supine Straight Leg Raises:  Strengthening;Right;10 reps;Supine Goniometric ROM: -5 to 73 degrees AAROM, pain limited   Assessment/Plan    PT Assessment Patient needs continued PT services  PT Problem List Decreased strength;Decreased range of motion;Decreased activity tolerance;Decreased balance;Decreased mobility;Pain       PT Treatment Interventions DME instruction;Gait training;Stair training;Functional mobility training;Therapeutic activities;Balance training;Therapeutic exercise;Neuromuscular re-education;Cognitive remediation;Patient/family education;Manual techniques    PT Goals (Current goals can be found in the Care Plan section)  Acute Rehab PT Goals Patient Stated Goal: Return to prior function PT Goal Formulation: With patient Time For Goal Achievement: 09/05/16 Potential to Achieve Goals: Good    Frequency BID   Barriers to discharge        Co-evaluation               AM-PAC PT "6 Clicks" Daily Activity  Outcome Measure Difficulty turning over in bed (including adjusting bedclothes, sheets and blankets)?: Total Difficulty moving from lying on back to sitting on the side of the bed? : Total Difficulty sitting down on and standing up from a chair with arms (e.g., wheelchair, bedside commode, etc,.)?: Total Help needed moving to and from a bed to chair (including a wheelchair)?: A Little Help needed walking in hospital room?: A Lot Help needed climbing 3-5 steps with a railing? : A Lot 6 Click Score: 10    End of Session Equipment Utilized During Treatment: Gait belt Activity Tolerance: Patient tolerated treatment well Patient left: in chair;with call bell/phone within reach;with chair alarm set;with SCD's reapplied;Other (comment) (polar care in place,  towel roll under heel) Nurse Communication: Mobility status PT Visit Diagnosis: Unsteadiness on feet (R26.81);Muscle weakness (generalized) (M62.81);Pain Pain - Right/Left: Right Pain - part of body: Knee    Time: 3729-0211 PT Time  Calculation (min) (ACUTE ONLY): 44 min   Charges:   PT Evaluation $PT Eval Low Complexity: 1 Procedure PT Treatments $Therapeutic Exercise: 8-22 mins   PT G Codes:   PT G-Codes **NOT FOR INPATIENT CLASS** Functional Assessment Tool Used: AM-PAC 6 Clicks Basic Mobility Functional Limitation: Mobility: Walking and moving around Mobility: Walking and Moving Around Current Status (D5520): At least 60 percent but less than 80 percent impaired, limited or restricted Mobility: Walking and Moving Around Goal Status [phone]): At least 1 percent but less than 20 percent impaired, limited or restricted    Phillips Grout PT, DPT    Alante Weimann 08/22/2016, 11:19 AM

## 2016-08-22 NOTE — Plan of Care (Signed)
Patient IV lost.  Dr. Ree Kida and gave verbal ok to not place new line, since probable DC for 7/12

## 2016-08-22 NOTE — Progress Notes (Signed)
Physical Therapy Treatment Patient Details Name: Paula Powell MRN: [id number] DOB: 08-31-1932 Today's Date: 08/22/2016    History of Present Illness Pt underwent R TKR without reported post-op complications. Attempted PT evlaution on POD#0 but sensory and motor still impaired following surgery. PT evaluation performed on POD#1. PMH includes depression, hypertension, IBS, and PVD    PT Comments    Pt demonstrates continued progress with physical therapy this afternoon. She is able to increase her ambulation distance but continues to move very slowly. Requires frequent cues for safe use of walker and safety with turns. Increase in R knee pain with all exercises and mobility. Able to complete exercises as instructed but with notable increase in knee pain during SAQ. Will continue to progress ambulation distance tomorrow as tolerated. Pt will benefit from PT services to address deficits in strength, balance, and mobility in order to return to full function at home.     Follow Up Recommendations  SNF     Equipment Recommendations  None recommended by PT;Other (comment) (Use RW at discharge)    Recommendations for Other Services OT consult     Precautions / Restrictions Precautions Precautions: Fall;Knee Precaution Booklet Issued: Yes (comment) Required Braces or Orthoses: Knee Immobilizer - Right Knee Immobilizer - Right: Discontinue once straight leg raise with < 10 degree lag Restrictions Weight Bearing Restrictions: Yes RLE Weight Bearing: Weight bearing as tolerated    Mobility  Bed Mobility Overal bed mobility: Needs Assistance Bed Mobility: Supine to Sit;Sit to Supine     Supine to sit: Min assist Sit to supine: Min assist   General bed mobility comments: Pt requires minA+1 to manage RLE. Cues for hand placement and sequencing. Improving speed. Pain increases and pt is very guarded with RLE during movement  Transfers Overall transfer level: Needs assistance Equipment  used: Rolling walker (2 wheeled) Transfers: Sit to/from Stand Sit to Stand: Min assist         General transfer comment: Improved strength with transfers today. Increase in pain and decreased weight shift to RLE. Fair stability in standing with UE support  Ambulation/Gait Ambulation/Gait assistance: Min assist Ambulation Distance (Feet): 80 Feet Assistive device: Rolling walker (2 wheeled) Gait Pattern/deviations: Step-to pattern;Decreased stance time - right;Decreased weight shift to right Gait velocity: Decreased Gait velocity interpretation: <1.8 ft/sec, indicative of risk for recurrent falls General Gait Details: Pt is able to significantly increase her ambulation distance this afternoon. She moves slowly with decreased weight shift to RLE and increase in pain. Denies DOE with ambulation. Cues for proper sequencing with walker. Cues to stay within confines of walker as pt tends to lift or push walker far out in front of her body while ambulating   Stairs            Wheelchair Mobility    Modified Rankin (Stroke Patients Only)       Balance Overall balance assessment: Needs assistance Sitting-balance support: No upper extremity supported Sitting balance-Leahy Scale: Good     Standing balance support: Bilateral upper extremity supported Standing balance-Leahy Scale: Fair Standing balance comment: Requires UE support on walker to stabilize.                             Cognition Arousal/Alertness: Awake/alert Behavior During Therapy: WFL for tasks assessed/performed Overall Cognitive Status: Within Functional Limits for tasks assessed  Exercises Total Joint Exercises Ankle Circles/Pumps: AROM;Both;10 reps;Supine Quad Sets: Strengthening;Both;10 reps;Supine Gluteal Sets: Strengthening;Both;10 reps;Supine Towel Squeeze: Strengthening;Both;10 reps;Supine Short Arc Quad: Strengthening;Right;10  reps;Supine Heel Slides: Strengthening;Right;10 reps;Supine Hip ABduction/ADduction: Strengthening;Right;10 reps;Supine Straight Leg Raises: Strengthening;Right;10 reps;Supine    General Comments        Pertinent Vitals/Pain Pain Assessment: 0-10 Pain Score: 10-Worst pain ever Pain Location: R knee pain, increases to 9/10 with movement Pain Descriptors / Indicators: Operative site guarding    Home Living                      Prior Function            PT Goals (current goals can now be found in the care plan section) Acute Rehab PT Goals Patient Stated Goal: Return to prior function PT Goal Formulation: With patient Time For Goal Achievement: 09/05/16 Potential to Achieve Goals: Good Progress towards PT goals: Progressing toward goals    Frequency    BID      PT Plan Current plan remains appropriate    Co-evaluation              AM-PAC PT "6 Clicks" Daily Activity  Outcome Measure  Difficulty turning over in bed (including adjusting bedclothes, sheets and blankets)?: Total Difficulty moving from lying on back to sitting on the side of the bed? : Total Difficulty sitting down on and standing up from a chair with arms (e.g., wheelchair, bedside commode, etc,.)?: Total Help needed moving to and from a bed to chair (including a wheelchair)?: A Little Help needed walking in hospital room?: A Lot Help needed climbing 3-5 steps with a railing? : A Lot 6 Click Score: 10    End of Session Equipment Utilized During Treatment: Gait belt Activity Tolerance: Patient tolerated treatment well Patient left: with call bell/phone within reach;with SCD's reapplied;Other (comment);in bed;with bed alarm set (polar care and bone foam in place) Nurse Communication: Mobility status PT Visit Diagnosis: Unsteadiness on feet (R26.81);Muscle weakness (generalized) (M62.81);Pain Pain - Right/Left: Right Pain - part of body: Knee     Time: 5188-4166 PT Time Calculation  (min) (ACUTE ONLY): 30 min  Charges:  $Gait Training: 8-22 mins $Therapeutic Exercise: 8-22 mins                    G Codes:      Lyndel Safe Rupert Azzara PT, DPT     Prather Failla 08/22/2016, 5:05 PM

## 2016-08-22 NOTE — Clinical Social Work Placement (Signed)
   CLINICAL SOCIAL WORK PLACEMENT  NOTE  Date:  08/22/2016  Patient Details  Name: LOREAL SCHUESSLER MRN: [id number] Date of Birth: Sep 26, 1932  Clinical Social Work is seeking post-discharge placement for this patient at the [place] level of care (*CSW will initial, date and re-position this form in  chart as items are completed):  Yes   Patient/family provided with [facility] Work Department's list of facilities offering this level of care within the geographic area requested by the patient (or if unable, by the patient's family).  Yes   Patient/family informed of their freedom to choose among providers that offer the needed level of care, that participate in Medicare, Medicaid or managed care program needed by the patient, have an available bed and are willing to accept the patient.  Yes   Patient/family informed of [facility]'s ownership interest in [facility] and [facility], as well as of the fact that they are under no obligation to receive care at these facilities.  PASRR submitted to EDS on 08/21/16     PASRR number received on 08/21/16     Existing PASRR number confirmed on       FL2 transmitted to all facilities in geographic area requested by pt/family on 08/21/16     FL2 transmitted to all facilities within larger geographic area on       Patient informed that his/her managed care company has contracts with or will negotiate with certain facilities, including the following:        Yes   Patient/family informed of bed offers received.  Patient [facility] bed at  [facility] )     Physician recommends and patient chooses bed at      Patient to be transferred to   on  .  Patient to be transferred to facility by       Patient family notified on   of transfer.  Name of family member notified:        PHYSICIAN       Additional Comment:    _______________________________________________ Marquasia Schmieder, Veronia Beets,  LCSW 08/22/2016, 1:56 PM

## 2016-08-22 NOTE — Clinical Social Work Note (Addendum)
Clinical Social Work Assessment  Patient Details  Name: NECHUMA BOVEN MRN: [id number] Date of Birth: Dec 03, 1932  Date of referral:  08/22/16               Reason for consult:  Facility Placement                Permission sought to share information with:  Chartered certified accountant granted to share information::  Yes, Verbal Permission Granted  Name::      [place]::   [place]   Relationship::     Contact Information:     Housing/Transportation Living arrangements for the past 2 months:  [place] of Information:  Patient Patient Interpreter Needed:  None Criminal Activity/Legal Involvement Pertinent to Current Situation/Hospitalization:  No - Comment as needed Significant Relationships:  Adult Children Lives with:  Adult Children Do you feel safe going back to the place where you live?  Yes Need for family participation in patient care:  Yes (Comment)  Care giving concerns:  Patient lives in [place] with her daughter Arbie Cookey, granddaughter and great-granddaughter.     Social Worker assessment / plan:  Holiday representative (CSW) received SNF consult. PT is recommending SNF. CSW met with patient to discuss D/C plan. Patient was alert and oriented x4 and was laying in the bed. CSW introduced self and explained role of CSW department. Patient reported that she lives in [place] with her daughter, granddaughter and great granddaughter. CSW explained that PT is recommending SNF and that Health Team will have to approve SNF stay. Patient verbalized her understanding and is agreeable to SNF search in [place]. FL2 complete and faxed out.   CSW presented be offers. Patient chose [facility]. CSW attempted to contact Health Team case manager twice and left a voicemail. [facility] admissions coordinator at [facility] is aware of above. CSW will continue to follow and assist as needed.   Health Team case manager  called CSW back and stated that she is working on SNF authorization for patient.   Employment status:  Retired Nurse, adult PT Recommendations:  [employer] / Referral to community resources:  [employer]  Patient/Family's Response to care:  Patient is agreeable to D/C to [facility].   Patient/Family's Understanding of and Emotional Response to Diagnosis, Current Treatment, and Prognosis:  Patient was very pleasant and thanked CSW for assistance.   Emotional Assessment Appearance:  Appears stated age Attitude/Demeanor/Rapport:    Affect (typically observed):  Accepting, Adaptable, Pleasant Orientation:  Oriented to Self, Oriented to Place, Oriented to Situation, Oriented to  Time Alcohol / Substance use:  Not Applicable Psych involvement (Current and /or in the community):  No (Comment)  Discharge Needs  Concerns to be addressed:  Discharge Planning Concerns Readmission within the last 30 days:  No Current discharge risk:  Dependent with Mobility Barriers to Discharge:  Continued Medical Work up   [employer], Veronia Beets, LCSW 08/22/2016, 1:59 PM

## 2016-08-22 NOTE — Progress Notes (Signed)
OT Cancellation Note  Patient Details Name: DOROTHA HIRSCHI MRN: [id number] DOB: [birth year]   Cancelled Treatment:    Reason Eval/Treat Not Completed: Pain limiting ability to participate. Order received, chart reviewed. Upon attempt, pt with "12/10" pain with pain meds given recently. Pt politely but adamantly declining OT evaluation at this time stating, "I don't mean to be ugly, but I couldn't move in this bed if my life depended on it." Pt agreeable to OT evaluation in the morning. Will re-attempt next day.   Jeni Salles, MPH, MS, OTR/L ascom [phone] 08/22/16, 2:19 PM

## 2016-08-22 NOTE — Progress Notes (Signed)
   Subjective: 1 Day Post-Op Procedure(s) (LRB): TOTAL KNEE ARTHROPLASTY (Right) Patient reports pain as 9 on 0-10 scale.   Patient is well, and has had no acute complaints or problems Denies any CP, SOB, ABD pain. We will continue therapy today.   Objective: Vital signs in last 24 hours: Temp:  [96.7 F (35.9 C)-98.6 F (37 C)] 98.6 F (37 C) (07/11 0453) Pulse Rate:  [47-80] 57 (07/11 0453) Resp:  [15-20] 18 (07/11 0453) BP: (126-172)/(49-88) 137/65 (07/11 0453) SpO2:  [91 %-99 %] 91 % (07/11 0453) Weight:  [85.3 kg (188 lb)-89.8 kg (198 lb)] 89.8 kg (198 lb) (07/11 0450)  Intake/Output from previous day: 07/10 0701 - 07/11 0700 In: 2532.5 [I.V.:2332.5; IV Piggyback:200] Out: 3790 [Urine:1490; Blood:200] Intake/Output this shift: No intake/output data recorded.   Recent Labs  08/21/16 1437 08/22/16 0424  HGB 10.7* 11.2*    Recent Labs  08/21/16 1437 08/22/16 0424  WBC 7.2 7.4  RBC 3.50* 3.62*  HCT 31.9* 33.5*  PLT 211 223    Recent Labs  08/21/16 1437 08/22/16 0424  NA  --  137  K  --  4.0  CL  --  104  CO2  --  25  BUN  --  13  CREATININE 1.00 0.75  GLUCOSE  --  113*  CALCIUM  --  8.8*   No results for input(s): LABPT, INR in the last 72 hours.  EXAM General - Patient is Alert, Appropriate and Oriented Extremity - Neurovascular intact Sensation intact distally Intact pulses distally Dorsiflexion/Plantar flexion intact No cellulitis present Compartment soft Dressing - dressing C/D/I and no drainage, wound vac intact Motor Function - intact, moving foot and toes well on exam.   Past Medical History:  Diagnosis Date  . Allergic eczema   . Anemia   . Carpal tunnel syndrome   . Chest pain, atypical   . Depression   . Depressive disorder   . Diverticulosis   . DJD (degenerative joint disease)   . Eczema   . Essential hypertension   . H/O: hysterectomy   . Hives   . IBD (inflammatory bowel disease)   . Irritable bowel syndrome   .  PONV (postoperative nausea and vomiting)    left knee arthroscopy  . Rhinitis, allergic   . Right carotid bruit     Assessment/Plan:   1 Day Post-Op Procedure(s) (LRB): TOTAL KNEE ARTHROPLASTY (Right) Active Problems:   Primary osteoarthritis of right knee  Estimated body mass index is 31.96 kg/m as calculated from the following:   Height as of this encounter: 5\' 6"  (1.676 m).   Weight as of this encounter: 89.8 kg (198 lb). Advance diet Up with therapy  Needs BM Recheck labs in the am CM to assist with discharge  DVT Prophylaxis - Lovenox, Foot Pumps and TED hose Weight-Bearing as tolerated to right leg   [name], PA-C [place] 08/22/2016, 8:08 AM

## 2016-08-23 DIAGNOSIS — J439 Emphysema, unspecified: Secondary | ICD-10-CM | POA: Diagnosis not present

## 2016-08-23 DIAGNOSIS — F329 Major depressive disorder, single episode, unspecified: Secondary | ICD-10-CM | POA: Diagnosis not present

## 2016-08-23 DIAGNOSIS — I2510 Atherosclerotic heart disease of native coronary artery without angina pectoris: Secondary | ICD-10-CM | POA: Diagnosis not present

## 2016-08-23 DIAGNOSIS — K589 Irritable bowel syndrome without diarrhea: Secondary | ICD-10-CM | POA: Diagnosis not present

## 2016-08-23 DIAGNOSIS — M25569 Pain in unspecified knee: Secondary | ICD-10-CM | POA: Diagnosis not present

## 2016-08-23 DIAGNOSIS — I10 Essential (primary) hypertension: Secondary | ICD-10-CM | POA: Diagnosis not present

## 2016-08-23 DIAGNOSIS — K5190 Ulcerative colitis, unspecified, without complications: Secondary | ICD-10-CM | POA: Diagnosis not present

## 2016-08-23 DIAGNOSIS — M150 Primary generalized (osteo)arthritis: Secondary | ICD-10-CM | POA: Diagnosis not present

## 2016-08-23 DIAGNOSIS — M1711 Unilateral primary osteoarthritis, right knee: Secondary | ICD-10-CM | POA: Diagnosis not present

## 2016-08-23 DIAGNOSIS — Z7401 Bed confinement status: Secondary | ICD-10-CM | POA: Diagnosis not present

## 2016-08-23 DIAGNOSIS — Z471 Aftercare following joint replacement surgery: Secondary | ICD-10-CM | POA: Diagnosis not present

## 2016-08-23 DIAGNOSIS — R6889 Other general symptoms and signs: Secondary | ICD-10-CM | POA: Diagnosis not present

## 2016-08-23 DIAGNOSIS — G5600 Carpal tunnel syndrome, unspecified upper limb: Secondary | ICD-10-CM | POA: Diagnosis not present

## 2016-08-23 DIAGNOSIS — Z96651 Presence of right artificial knee joint: Secondary | ICD-10-CM | POA: Diagnosis not present

## 2016-08-23 DIAGNOSIS — I700 Atherosclerosis of aorta: Secondary | ICD-10-CM | POA: Diagnosis not present

## 2016-08-23 DIAGNOSIS — I058 Other rheumatic mitral valve diseases: Secondary | ICD-10-CM | POA: Diagnosis not present

## 2016-08-23 DIAGNOSIS — D649 Anemia, unspecified: Secondary | ICD-10-CM | POA: Diagnosis not present

## 2016-08-23 DIAGNOSIS — R05 Cough: Secondary | ICD-10-CM | POA: Diagnosis not present

## 2016-08-23 DIAGNOSIS — R0902 Hypoxemia: Secondary | ICD-10-CM | POA: Diagnosis not present

## 2016-08-23 LAB — BASIC METABOLIC PANEL
ANION GAP: 7 (ref 5–15)
BUN: 15 mg/dL (ref 6–20)
CALCIUM: 8.9 mg/dL (ref 8.9–10.3)
CHLORIDE: 97 mmol/L — AB (ref 101–111)
CO2: 28 mmol/L (ref 22–32)
Creatinine, Ser: 0.91 mg/dL (ref 0.44–1.00)
GFR calc non Af Amer: 58 mL/min — ABNORMAL LOW (ref >60)
GLUCOSE: 107 mg/dL — AB (ref 65–99)
Potassium: 3.5 mmol/L (ref 3.5–5.1)
Sodium: 132 mmol/L — ABNORMAL LOW (ref 135–145)

## 2016-08-23 LAB — CBC
HEMATOCRIT: 32.1 % — AB (ref 35.0–47.0)
HEMOGLOBIN: 11 g/dL — AB (ref 12.0–16.0)
MCH: 31.2 pg (ref 26.0–34.0)
MCHC: 34.3 g/dL (ref 32.0–36.0)
MCV: 90.9 fL (ref 80.0–100.0)
Platelets: 211 10*3/uL (ref 150–440)
RBC: 3.53 MIL/uL — ABNORMAL LOW (ref 3.80–5.20)
RDW: 12.8 % (ref 11.5–14.5)
WBC: 7.3 10*3/uL (ref 3.6–11.0)

## 2016-08-23 MED ORDER — OXYCODONE HCL 5 MG PO TABS: 5.0000 mg | ORAL_TABLET | ORAL | 0 refills | Status: DC | PRN

## 2016-08-23 MED ORDER — ENOXAPARIN SODIUM 40 MG/0.4ML ~~LOC~~ SOLN: 40.0000 mg | SUBCUTANEOUS | 0 refills | Status: DC

## 2016-08-23 NOTE — Discharge Summary (Signed)
Physician Discharge Summary  Patient ID: Paula Powell MRN: [id number] DOB/AGE: 81-Aug-1934 81 y.o.  Admit date: 08/21/2016 Discharge date: 08/23/2016  Admission Diagnoses:  PRIMARY OSTEOARTHRITIS OF RIGHT KNEE   Discharge Diagnoses: Patient Active Problem List   Diagnosis Date Noted  . Primary osteoarthritis of right knee 08/21/2016    Past Medical History:  Diagnosis Date  . Allergic eczema   . Anemia   . Carpal tunnel syndrome   . Chest pain, atypical   . Depression   . Depressive disorder   . Diverticulosis   . DJD (degenerative joint disease)   . Eczema   . Essential hypertension   . H/O: hysterectomy   . Hives   . IBD (inflammatory bowel disease)   . Irritable bowel syndrome   . PONV (postoperative nausea and vomiting)    left knee arthroscopy  . Rhinitis, allergic   . Right carotid bruit      Transfusion: none   Consultants (if any):   Discharged Condition: Improved  Hospital Course: Paula Powell is an 83 y.o. female who was admitted 08/21/2016 with a diagnosis of right knee osteoarthritis and went to the operating room on 08/21/2016 and underwent the above named procedures.    Surgeries: Procedure(s): TOTAL KNEE ARTHROPLASTY on 08/21/2016 Patient tolerated the surgery well. Taken to PACU where she was stabilized and then transferred to the orthopedic floor.  Started on Lovenox 30 q 12 hrs. Foot pumps applied bilaterally at 80 mm. Heels elevated on bed with rolled towels. No evidence of DVT. Negative Homan. Physical therapy started on day #1 for gait training and transfer. OT started day #1 for ADL and assisted devices.  Patient's foley was d/c on day #1. Patient's IV  was d/c on day #2.  On post op day #2 patient was stable and ready for discharge to SNF.  Implants: Medacta GMK 4 right femur for right tibia with a 14 mm PS insert size 4 11 mm x 30 mm stem and size 2 patella all components cemented  She was given perioperative antibiotics:   Anti-infectives    Start     Dose/Rate Route Frequency Ordered Stop   08/21/16 1800  ceFAZolin (ANCEF) IVPB 2g/100 mL premix     2 g 200 mL/hr over 30 Minutes Intravenous Every 6 hours 08/21/16 1406 08/22/16 0658   08/20/16 2245  ceFAZolin (ANCEF) IVPB 2g/100 mL premix     2 g 200 mL/hr over 30 Minutes Intravenous  Once 08/20/16 2243 08/21/16 1128    .  She was given sequential compression devices, early ambulation, and Lovenox for DVT prophylaxis.  She benefited maximally from the hospital stay and there were no complications.    Recent vital signs:  Vitals:   08/23/16 0800 08/23/16 1012  BP: (!) 173/66   Pulse: 70 71  Resp: 18   Temp: 98 F (36.7 C)     Recent laboratory studies:  Lab Results  Component Value Date   HGB 11.0 (L) 08/23/2016   HGB 11.2 (L) 08/22/2016   HGB 10.7 (L) 08/21/2016   Lab Results  Component Value Date   WBC 7.3 08/23/2016   PLT 211 08/23/2016   Lab Results  Component Value Date   INR 1.01 08/08/2016   Lab Results  Component Value Date   NA 132 (L) 08/23/2016   K 3.5 08/23/2016   CL 97 (L) 08/23/2016   CO2 28 08/23/2016   BUN 15 08/23/2016   CREATININE 0.91 08/23/2016   GLUCOSE 107 (  H) 08/23/2016    Discharge Medications:   Allergies as of 08/23/2016      Reactions   Ace Inhibitors Cough      Medication List    STOP taking these medications   traMADol 50 MG tablet Commonly known as:  ULTRAM     TAKE these medications   acetaminophen 500 MG tablet Commonly known as:  TYLENOL Take 1,000 mg by mouth every 6 (six) hours as needed for mild pain.   citalopram 20 MG tablet Commonly known as:  CELEXA Take 30 mg by mouth daily. In am.   cyclobenzaprine 10 MG tablet Commonly known as:  FLEXERIL Take 10 mg by mouth 3 (three) times daily as needed for muscle spasms.   enoxaparin 40 MG/0.4ML injection Commonly known as:  LOVENOX Inject 0.4 mLs (40 mg total) into the skin daily.   hydrochlorothiazide 25 MG tablet Commonly  known as:  HYDRODIURIL Take 25 mg by mouth daily. In am.   losartan 100 MG tablet Commonly known as:  COZAAR Take 100 mg by mouth daily. In am.   mesalamine 1.2 g EC tablet Commonly known as:  LIALDA Take 4 tablets by mouth daily with breakfast.   oxyCODONE 5 MG immediate release tablet Commonly known as:  Oxy IR/ROXICODONE Take 1-2 tablets (5-10 mg total) by mouth every 3 (three) hours as needed for breakthrough pain.            Durable Medical Equipment        Start     Ordered   08/21/16 1407  DME Walker rolling  Once    Question:  Patient needs a walker to treat with the following condition  Answer:  Status post total right knee replacement   08/21/16 1406   08/21/16 1407  DME 3 n 1  Once     08/21/16 1406   08/21/16 1407  DME Bedside commode  Once    Question:  Patient needs a bedside commode to treat with the following condition  Answer:  Status post total right knee replacement   08/21/16 1406      Diagnostic Studies: Dg Knee 1-2 Views Right  Result Date: 08/21/2016 CLINICAL DATA:  Total knee arthroplasty EXAM: RIGHT KNEE - 1-2 VIEW COMPARISON:  07/25/2016 FINDINGS: Torn knee arthroplasty has been performed. There is anatomic alignment. No breakage or loosening of the hardware. No fracture. IMPRESSION: Total knee arthroplasty anatomically aligned. Electronically Signed   By: Marybelle Killings M.D.   On: 08/21/2016 13:38   Ct Knee Right Wo Contrast  Result Date: 07/25/2016 CLINICAL DATA:  Patient for right knee replacement. Preoperative planning examination. EXAM: CT OF THE RIGHT KNEE WITHOUT CONTRAST TECHNIQUE: Multidetector CT imaging of the right knee was performed according to the standard protocol. Multiplanar CT image reconstructions were also generated. Axial imaging only of the right hip and ankle was performed. COMPARISON:  Plain films right knee 06/15/2016. FINDINGS: Bones/Joint/Cartilage The patient has advanced tricompartmental osteoarthritis with bulky  osteophytes seen about all 3 compartments. There is some subchondral sclerosis about the medial and lateral compartments. No fracture or focal bony lesion. Axial imaging only of the left hip and ankle demonstrates no acute abnormality. Ligaments Suboptimally assessed by CT. Muscles and Tendons Appear normal. Soft tissues Atherosclerosis is noted. The patient has a moderate knee joint scratch the patient has a moderate knee effusion. IMPRESSION: Advanced tricompartmental osteoarthritis right knee. No acute abnormality. Moderate right knee effusion. Atherosclerosis. Electronically Signed   By: Inge Rise M.D.  On: 07/25/2016 10:39    Disposition: 01-Home or Self Care     Contact information for follow-up providers    Hessie Knows, MD Follow up in 2 week(s).   Specialty:  Orthopedic Surgery Contact information: [place] [address] [phone]            Contact information for after-discharge care    Destination    [facility] Follow up.   Specialty:  [place] information: [place] [phone]                   Signed: Feliberto Gottron 08/23/2016, 3:43 PM

## 2016-08-23 NOTE — Progress Notes (Signed)
Physical Therapy Treatment Patient Details Name: Paula Powell MRN: [id number] DOB: [birth year] Today's Date: 08/23/2016    History of Present Illness Pt underwent R TKR without reported post-op complications. Attempted PT evlaution on POD#0 but sensory and motor still impaired following surgery. PT evaluation performed on POD#1. PMH includes depression, hypertension, IBS, and PVD    PT Comments    Pt agreeable to PT; reports 10/10 pain R knee. Pt requires significant time with all mobility due to pain and guarding. Min A for lower extremities with bed mobility (support RLE), Mod/Min A for transfers (mild improvement throughout session) and Min A for ambulation. All mobility requires heavy cueing and increased instructions. Rolling walker lowered for improved weight shirt through upper extremities to assist weight bearing on right. Pt participates in stretch/strength exercises. Range right knee 0-70 degrees; significant guarding preventing further flexion. Pt up in chair comfortably. Continue PT this afternoon for progression of range, strength, endurance to improve all functional mobility.    Follow Up Recommendations  SNF     Equipment Recommendations  None recommended by PT;Other (comment)    Recommendations for Other Services       Precautions / Restrictions Precautions Precautions: Fall;Knee Required Braces or Orthoses: Knee Immobilizer - Right Knee Immobilizer - Right: Discontinue once straight leg raise with < 10 degree lag Restrictions Weight Bearing Restrictions: Yes RLE Weight Bearing: Weight bearing as tolerated    Mobility  Bed Mobility Overal bed mobility: Needs Assistance Bed Mobility: Supine to Sit     Supine to sit: Min assist     General bed mobility comments: Very slow, guarding RLE greatly due to pain. Able to manage trunk with use of rail. with assist to mobilize and support RLE  Transfers Overall transfer level: Needs assistance Equipment used: Rolling  walker (2 wheeled) Transfers: Sit to/from Stand Sit to Stand: Mod assist;Min assist         General transfer comment: Sit to/from stand bed, BSC and recliner. Initial with Mod A, improving with subsequent stands, but continues to require increased cueing for hand placement, sequencing and quad/glut activation  Ambulation/Gait Ambulation/Gait assistance: Min assist Ambulation Distance (Feet): 5 Feet (3 to [facility]) Assistive device: Rolling walker (2 wheeled) Gait Pattern/deviations: Step-to pattern;Decreased stance time - right;Decreased weight shift to right;Antalgic;Trunk flexed     General Gait Details: Rw lowered to appropriate height for improved wb'g through UEs. Pt with painful, slow gait with cues for sequencing, step length and wt shift with R QS   Stairs            Wheelchair Mobility    Modified Rankin (Stroke Patients Only)       Balance Overall balance assessment: Needs assistance Sitting-balance support: Feet supported;Bilateral upper extremity supported Sitting balance-Leahy Scale: Fair   Postural control: Other (comment) (Severe guarding of RLE decreasing balance in sit) Standing balance support: Bilateral upper extremity supported Standing balance-Leahy Scale: Fair                              Cognition Arousal/Alertness: Awake/alert Behavior During Therapy: WFL for tasks assessed/performed Overall Cognitive Status: Within Functional Limits for tasks assessed                                        Exercises Total Joint Exercises Ankle Circles/Pumps: AROM;Both;20 reps Quad Sets: Strengthening;Both;20 reps (gravity assisted  R knee ext) Gluteal Sets: Strengthening;Both;20 reps Knee Flexion: AROM;Right;10 reps;Seated (3 positions each rep with 10 sec hold each) Goniometric ROM: 0-70    General Comments        Pertinent Vitals/Pain Pain Assessment: 0-10 Pain Score: 10-Worst pain ever Pain Location: R knee Pain  Descriptors / Indicators: Operative site guarding Pain Intervention(s): Premedicated before session;Repositioned;Ice applied;Limited activity within patient's tolerance;Monitored during session    Home Living                      Prior Function            PT Goals (current goals can now be found in the care plan section) Progress towards PT goals: Progressing toward goals (slowly)    Frequency    BID      PT Plan Current plan remains appropriate    Co-evaluation              AM-PAC PT "6 Clicks" Daily Activity  Outcome Measure  Difficulty turning over in bed (including adjusting bedclothes, sheets and blankets)?: Total Difficulty moving from lying on back to sitting on the side of the bed? : Total Difficulty sitting down on and standing up from a chair with arms (e.g., wheelchair, bedside commode, etc,.)?: Total Help needed moving to and from a bed to chair (including a wheelchair)?: A Little Help needed walking in hospital room?: A Lot Help needed climbing 3-5 steps with a railing? : Total 6 Click Score: 9    End of Session Equipment Utilized During Treatment: Gait belt Activity Tolerance: Patient limited by pain Patient left: in chair;with call bell/phone within reach;with chair alarm set;Other (comment) (polar care )   PT Visit Diagnosis: Unsteadiness on feet (R26.81);Muscle weakness (generalized) (M62.81);Pain Pain - Right/Left: Right Pain - part of body: Knee     Time: 7943-2761 PT Time Calculation (min) (ACUTE ONLY): 39 min  Charges:  $Gait Training: 8-22 mins $Therapeutic Exercise: 8-22 mins $Therapeutic Activity: 8-22 mins                    G Codes:        Larae Grooms, PTA 08/23/2016, 10:55 AM

## 2016-08-23 NOTE — Progress Notes (Addendum)
Pt BP was elevated overnight. MD notified. Metoprolol administered. Pt does have a hx of HTN.   08/22/16 0453 08/22/16 1605 08/22/16 2252  Vitals  BP 137/65 (!) 155/76 (!) 180/98     08/23/16 0330  Vitals  BP (!) 162/65   Pt has tremors in hands and says this is  "normal" for her. Refused bone foam related to pain. Educated.

## 2016-08-23 NOTE — Discharge Instructions (Signed)

## 2016-08-23 NOTE — Progress Notes (Signed)
   Subjective: 2 Days Post-Op Procedure(s) (LRB): TOTAL KNEE ARTHROPLASTY (Right) Patient reports pain as moderate.   Patient is well, and has had no acute complaints or problems Denies any CP, SOB, ABD pain. We will continue therapy today.   Objective: Vital signs in last 24 hours: Temp:  [97.7 F (36.5 C)-98 F (36.7 C)] 97.7 F (36.5 C) (07/11 2252) Pulse Rate:  [72-85] 72 (07/12 0330) Resp:  [17-18] 18 (07/11 2252) BP: (155-180)/(65-98) 162/65 (07/12 0330) SpO2:  [94 %-96 %] 96 % (07/11 2252)  Intake/Output from previous day: 07/11 0701 - 07/12 0700 In: 360 [P.O.:360] Out: 225 [Urine:225] Intake/Output this shift: No intake/output data recorded.   Recent Labs  08/21/16 1437 08/22/16 0424 08/23/16 0341  HGB 10.7* 11.2* 11.0*    Recent Labs  08/22/16 0424 08/23/16 0341  WBC 7.4 7.3  RBC 3.62* 3.53*  HCT 33.5* 32.1*  PLT 223 211    Recent Labs  08/22/16 0424 08/23/16 0341  NA 137 132*  K 4.0 3.5  CL 104 97*  CO2 25 28  BUN 13 15  CREATININE 0.75 0.91  GLUCOSE 113* 107*  CALCIUM 8.8* 8.9   No results for input(s): LABPT, INR in the last 72 hours.  EXAM General - Patient is Alert, Appropriate and Oriented Extremity - Neurovascular intact Sensation intact distally Intact pulses distally Dorsiflexion/Plantar flexion intact No cellulitis present Compartment soft Dressing - dressing C/D/I and no drainage, wound vac intact with out drainage Motor Function - intact, moving foot and toes well on exam.   Past Medical History:  Diagnosis Date  . Allergic eczema   . Anemia   . Carpal tunnel syndrome   . Chest pain, atypical   . Depression   . Depressive disorder   . Diverticulosis   . DJD (degenerative joint disease)   . Eczema   . Essential hypertension   . H/O: hysterectomy   . Hives   . IBD (inflammatory bowel disease)   . Irritable bowel syndrome   . PONV (postoperative nausea and vomiting)    left knee arthroscopy  . Rhinitis,  allergic   . Right carotid bruit     Assessment/Plan:   2 Days Post-Op Procedure(s) (LRB): TOTAL KNEE ARTHROPLASTY (Right) Active Problems:   Primary osteoarthritis of right knee  Estimated body mass index is 31.96 kg/m as calculated from the following:   Height as of this encounter: 5\' 6"  (1.676 m).   Weight as of this encounter: 89.8 kg (198 lb). Advance diet Up with therapy  Needs BM Labs are stable CM to assist with discharge to SNF tomorrow   DVT Prophylaxis - Lovenox, Foot Pumps and TED hose Weight-Bearing as tolerated to right leg   [name], PA-C [place] 08/23/2016, 8:03 AM

## 2016-08-23 NOTE — Clinical Social Work Note (Signed)
Pt is ready for discharge today. CSW obtained [facility] Auth [phone], valid for 7 days). Facility is ready to admit pt as they have received discharge information. Pt is aware and agreeable to discharge plan. RN notified family. RN will call report. [facility] will provide transportation. CSW is signing off as no further needs identified.   Darden Dates, MSW, LCSW  Clinical Social Worker  [phone]

## 2016-08-23 NOTE — Progress Notes (Signed)
Report called to Levada Dy, RN @ [facility].

## 2016-08-23 NOTE — Anesthesia Postprocedure Evaluation (Signed)
Anesthesia Post Note  Patient: Paula Powell  Procedure(s) Performed: Procedure(s) (LRB): TOTAL KNEE ARTHROPLASTY (Right)  Patient location during evaluation: Other Anesthesia Type: Spinal Level of consciousness: awake and alert and oriented Pain management: pain level controlled Vital Signs Assessment: post-procedure vital signs reviewed and stable Respiratory status: spontaneous breathing Cardiovascular status: blood pressure returned to baseline Anesthetic complications: no Comments: No apparent anesthetic complications according to staff.  Patient d/ced prior to visit.     Last Vitals:  Vitals:   08/23/16 1012 08/23/16 1450  BP:  (!) 153/60  Pulse: 71 70  Resp:  18  Temp:  36.8 C    Last Pain:  Vitals:   08/23/16 1450  TempSrc: Oral  PainSc: 4                  Daronte Shostak

## 2016-08-23 NOTE — Progress Notes (Signed)
Physical Therapy Treatment Patient Details Name: Paula Powell MRN: [id number] DOB: 08/24/1932 Today's Date: 08/23/2016    History of Present Illness Pt underwent R TKR without reported post-op complications. Attempted PT evlaution on POD#0 but sensory and motor still impaired following surgery. PT evaluation performed on POD#1. PMH includes depression, hypertension, IBS, and PVD    PT Comments    Pt agreeable to PT; denies too much pain at rest, but quickly elevates to 10 with movement. Pt with greater difficulty with all mobility this afternoon requiring Mod A throughout. Pt needs increased time for stretching into R knee flexion and ultimately attains less range than morning session. (62 versus 70). Pt encouraged to call nursing staff every hour to 2 hours to place lower extremities down to allow for work on stretching. Continue PT to progress range, strength, endurance to improve functional mobility.    Follow Up Recommendations  SNF     Equipment Recommendations  None recommended by PT;Other (comment)    Recommendations for Other Services       Precautions / Restrictions Precautions Precautions: Fall;Knee Required Braces or Orthoses: Knee Immobilizer - Right Knee Immobilizer - Right: Discontinue once straight leg raise with < 10 degree lag Restrictions Weight Bearing Restrictions: Yes RLE Weight Bearing: Weight bearing as tolerated    Mobility  Bed Mobility               General bed mobility comments: Not tested; up in chair and wishes to remain in chair post session  Transfers Overall transfer level: Needs assistance Equipment used: Rolling walker (2 wheeled) Transfers: Sit to/from Stand Sit to Stand: Mod assist         General transfer comment: Several attempts with rocking motion to get up out of recliner. Mod A for BSC as well. Increased assist for descent from morning session.  Ambulation/Gait Ambulation/Gait assistance: Min assist;Mod  assist Ambulation Distance (Feet): 5 Feet (performed twice) Assistive device: Rolling walker (2 wheeled) Gait Pattern/deviations: Step-to pattern;Decreased stance time - right;Decreased weight shift to right;Antalgic;Trunk flexed Gait velocity: slow Gait velocity interpretation: <1.8 ft/sec, indicative of risk for recurrent falls General Gait Details: Greater difficulty this session maintaining upright balance, clearance of LLE due to decreased ability to weight shift R   Stairs            Wheelchair Mobility    Modified Rankin (Stroke Patients Only)       Balance Overall balance assessment: Needs assistance Sitting-balance support: Feet supported;Bilateral upper extremity supported Sitting balance-Leahy Scale: Fair   Postural control: Other (comment) (Severe guarding of RLE decreasing balance in sit) Standing balance support: Bilateral upper extremity supported Standing balance-Leahy Scale: Fair                              Cognition Arousal/Alertness: Awake/alert Behavior During Therapy: WFL for tasks assessed/performed Overall Cognitive Status: Within Functional Limits for tasks assessed                                        Exercises Total Joint Exercises Ankle Circles/Pumps: AROM;Both;20 reps Quad Sets: Strengthening;Both;20 reps (also in stand 10x ) Gluteal Sets: Strengthening;Both;10 reps Long Arc Quad: AAROM;Right;10 reps;Seated (2 sets) Knee Flexion: AROM;Right;10 reps;Seated (8 minutes working toes towards line on the floor) Goniometric ROM: 0-62 Other Exercises Other Exercises: Assist for personal hygiene in stand  General Comments        Pertinent Vitals/Pain Pain Assessment: 0-10 Pain Score: 10-Worst pain ever Pain Location: R knee Pain Descriptors / Indicators: Operative site guarding Pain Intervention(s): Premedicated before session;Limited activity within patient's tolerance;Monitored during session;Ice applied     Home Living                      Prior Function            PT Goals (current goals can now be found in the care plan section) Progress towards PT goals: Progressing toward goals (slowly)    Frequency    BID      PT Plan Current plan remains appropriate    Co-evaluation              AM-PAC PT "6 Clicks" Daily Activity  Outcome Measure  Difficulty turning over in bed (including adjusting bedclothes, sheets and blankets)?: Total Difficulty moving from lying on back to sitting on the side of the bed? : Total Difficulty sitting down on and standing up from a chair with arms (e.g., wheelchair, bedside commode, etc,.)?: Total Help needed moving to and from a bed to chair (including a wheelchair)?: A Lot Help needed walking in hospital room?: A Lot Help needed climbing 3-5 steps with a railing? : Total 6 Click Score: 8    End of Session Equipment Utilized During Treatment: Gait belt Activity Tolerance: Patient limited by pain Patient left: in chair;with call bell/phone within reach;with chair alarm set;Other (comment) (polar care, OT in room)   PT Visit Diagnosis: Unsteadiness on feet (R26.81);Muscle weakness (generalized) (M62.81);Pain Pain - Right/Left: Right Pain - part of body: Knee     Time: 6301-6010 PT Time Calculation (min) (ACUTE ONLY): 51 min  Charges:  $Gait Training: 8-22 mins $Therapeutic Exercise: 8-22 mins $Therapeutic Activity: 8-22 mins                    G Codes:        Larae Grooms, PTA 08/23/2016, 2:35 PM

## 2016-08-23 NOTE — Evaluation (Signed)
Occupational Therapy Evaluation Patient Details Name: Paula Powell MRN: [id number] DOB: 1932/08/17 Today's Date: 08/23/2016    History of Present Illness Pt underwent R TKR without reported post-op complications. Attempted PT evlaution on POD#0 but sensory and motor still impaired following surgery. PT evaluation performed on POD#1. PMH includes depression, hypertension, IBS, and PVD   Clinical Impression   Pt seen for OT evaluation this date. Pt is 83 year old female s/p R TKR. Pt was independent in all ADL (limited household mobility without AD) prior to surgery and is eager to return to PLOF.  Pt currently requires min-moderate assist for LB dressing while in seated position due to pain and limited AROM of R knee. Pt very limited by pain. Educated in cognitive behavioral pain coping strategies and pursed lip breathing to support pain management. Pt would benefit from instruction in dressing techniques with or without assistive devices for dressing and bathing skills.  Pt would also benefit from recommendations for home modifications to increase safety in the home and prevent falls. Pt is a good candidate for STR following hospitalization.      Follow Up Recommendations  SNF    Equipment Recommendations  Tub/shower seat;Other (comment) (grab bars)    Recommendations for Other Services       Precautions / Restrictions Precautions Precautions: Fall;Knee Precaution Booklet Issued: No Required Braces or Orthoses: Knee Immobilizer - Right Knee Immobilizer - Right: Discontinue once straight leg raise with < 10 degree lag Restrictions Weight Bearing Restrictions: Yes RLE Weight Bearing: Weight bearing as tolerated      Mobility Bed Mobility               General bed mobility comments: deferred, up in recliner for session  Transfers Overall transfer level: Needs assistance Equipment used: Rolling walker (2 wheeled) Transfers: Sit to/from Stand Sit to Stand: Mod assist          General transfer comment: increased effort, BUE use, significant pain    Balance Overall balance assessment: Needs assistance Sitting-balance support: Feet supported;Bilateral upper extremity supported Sitting balance-Leahy Scale: Fair   Postural control: Other (comment) (Severe guarding of RLE decreasing balance in sit) Standing balance support: Bilateral upper extremity supported Standing balance-Leahy Scale: Fair Standing balance comment: Requires UE support on walker to stabilize.                            ADL either performed or assessed with clinical judgement   ADL Overall ADL's : Needs assistance/impaired Eating/Feeding: Sitting;Set up   Grooming: Set up;Sitting   Upper Body Bathing: Set up;Supervision/ safety;Sitting   Lower Body Bathing: Minimal assistance;Moderate assistance;Sitting/lateral leans   Upper Body Dressing : Set up;Supervision/safety;Sitting   Lower Body Dressing: Moderate assistance;Minimal assistance;With adaptive equipment;Sitting/lateral leans Lower Body Dressing Details (indicate cue type and reason): pt educated in AE for LB dressing, would benefit from additional training to support recall and carry over of proper technique                     Vision Baseline Vision/History: Wears glasses Wears Glasses: Reading only Patient Visual Report: No change from baseline Vision Assessment?: No apparent visual deficits     Perception     Praxis      Pertinent Vitals/Pain Pain Assessment: 0-10 Pain Score: 10-Worst pain ever Pain Location: R knee Pain Descriptors / Indicators: Operative site guarding Pain Intervention(s): Limited activity within patient's tolerance;Monitored during session;Patient requesting pain meds-RN notified;RN  gave pain meds during session;Ice applied     Hand Dominance Right   Extremity/Trunk Assessment Upper Extremity Assessment Upper Extremity Assessment: Overall WFL for tasks assessed [facility] for  age, grossly 4/5 bilaterally)   Lower Extremity Assessment Lower Extremity Assessment: RLE deficits/detail;Defer to PT evaluation RLE Deficits / Details: Reports intact sensation throughout RLE. Able to perform SLR and SAQ without assist but with considerable increase in pain. LLE strength is grossly [facility]       Communication Communication Communication: No difficulties   Cognition Arousal/Alertness: Awake/alert Behavior During Therapy: WFL for tasks assessed/performed Overall Cognitive Status: Within Functional Limits for tasks assessed                                     General Comments       Exercises  Other Exercises Other Exercises: pt educated in cognitive behavioral pain coping strategies and pursed lip breathing to support pain management, pt demo'd understanding   Shoulder Instructions      Home Living Family/patient expects to be discharged to:: Skilled nursing facility Living Arrangements: Other relatives;Children;Other (Comment) (with w/ daughter, granddaughter, and great granddaughter) Available Help at Discharge: Family Type of Home: House Home Access: Stairs to enter [employer] of Steps: 2 Entrance Stairs-Rails:  (grab bar to hold) Home Layout: One level     Bathroom Shower/Tub: Teacher, early years/pre: Standard     Home Equipment: Environmental consultant - 2 wheels;Walker - standard;Cane - single point;Bedside commode          Prior Functioning/Environment Level of Independence: Needs assistance  Gait / Transfers Assistance Needed: Pt ambulates limited distances without assistive device. 1 fall in the last 12 months (tripped over threshold) ADL's / Homemaking Assistance Needed: Independent with ADLs, assist with IADLs            OT Problem List: Decreased strength;Pain;Decreased range of motion;Decreased safety awareness;Decreased activity tolerance;Impaired balance (sitting and/or standing);Decreased knowledge of use of DME  or AE      OT Treatment/Interventions: Self-care/ADL training;Therapeutic exercise;Therapeutic activities;Energy conservation;DME and/or AE instruction;Patient/family education    OT Goals(Current goals can be found in the care plan section) Acute Rehab OT Goals Patient Stated Goal: Return to prior function OT Goal Formulation: With patient Time For Goal Achievement: 09/06/16 Potential to Achieve Goals: Good  OT Frequency: Min 1X/week   Barriers to D/C:            Co-evaluation              AM-PAC PT "6 Clicks" Daily Activity     Outcome Measure Help from another person eating meals?: None Help from another person taking care of personal grooming?: None Help from another person toileting, which includes using toliet, bedpan, or urinal?: A Lot Help from another person bathing (including washing, rinsing, drying)?: A Lot Help from another person to put on and taking off regular upper body clothing?: A Little Help from another person to put on and taking off regular lower body clothing?: A Lot 6 Click Score: 17   End of Session Equipment Utilized During Treatment: Rolling walker;Gait belt Nurse Communication: Patient requests pain meds  Activity Tolerance: Patient tolerated treatment well;Patient limited by pain Patient left: in chair;with call bell/phone within reach;with chair alarm set;with SCD's reapplied;Other (comment) (polar care in place)  OT Visit Diagnosis: Other abnormalities of gait and mobility (R26.89);Muscle weakness (generalized) (M62.81);Pain Pain - Right/Left: Right Pain -  part of body: Knee                Time: 1422-1457 OT Time Calculation (min): 35 min Charges:  OT General Charges $OT Visit: 1 Procedure OT Evaluation $OT Eval Low Complexity: 1 Procedure OT Treatments $Therapeutic Activity: 8-22 mins G-Codes:     Jeni Salles, MPH, MS, OTR/L ascom [phone] 08/23/16, 4:15 PM

## 2016-08-23 NOTE — Progress Notes (Signed)
OT Cancellation Note  Patient Details Name: Paula Powell MRN: [id number] DOB: 30-Dec-1932   Cancelled Treatment:    Reason Eval/Treat Not Completed: Patient at procedure or test/ unavailable. Upon initial attempt to evaluate this morning, pt working with physical therapy. Will re-attempt OT evaluation at later time as pt is available.  Jeni Salles, MPH, MS, OTR/L ascom [phone] 08/23/16, 10:25 AM

## 2016-08-24 ENCOUNTER — Other Ambulatory Visit: Payer: Self-pay | Admitting: Internal Medicine

## 2016-08-24 ENCOUNTER — Ambulatory Visit
Admission: RE | Admit: 2016-08-24 | Discharge: 2016-08-24 | Disposition: A | Discharge status: Home / Self Care | Payer: PPO | Source: Ambulatory Visit | Attending: Internal Medicine | Admitting: Internal Medicine

## 2016-08-24 DIAGNOSIS — J439 Emphysema, unspecified: Secondary | ICD-10-CM | POA: Diagnosis not present

## 2016-08-24 DIAGNOSIS — R0902 Hypoxemia: Secondary | ICD-10-CM | POA: Insufficient documentation

## 2016-08-24 DIAGNOSIS — I058 Other rheumatic mitral valve diseases: Secondary | ICD-10-CM | POA: Insufficient documentation

## 2016-08-24 DIAGNOSIS — I2510 Atherosclerotic heart disease of native coronary artery without angina pectoris: Secondary | ICD-10-CM | POA: Diagnosis not present

## 2016-08-24 DIAGNOSIS — M1711 Unilateral primary osteoarthritis, right knee: Secondary | ICD-10-CM | POA: Diagnosis not present

## 2016-08-24 DIAGNOSIS — I700 Atherosclerosis of aorta: Secondary | ICD-10-CM | POA: Diagnosis not present

## 2016-08-24 DIAGNOSIS — M150 Primary generalized (osteo)arthritis: Secondary | ICD-10-CM | POA: Diagnosis not present

## 2016-08-24 DIAGNOSIS — R05 Cough: Secondary | ICD-10-CM | POA: Diagnosis not present

## 2016-08-24 DIAGNOSIS — K5190 Ulcerative colitis, unspecified, without complications: Secondary | ICD-10-CM | POA: Diagnosis not present

## 2016-08-24 MED ORDER — IOPAMIDOL (ISOVUE-370) INJECTION 76%
75.0000 mL | Freq: Once | INTRAVENOUS | Status: AC | PRN
  Administered 2016-08-24: 75 mL via INTRAVENOUS

## 2016-08-24 NOTE — Clinical Social Work Placement (Signed)
   CLINICAL SOCIAL WORK PLACEMENT  NOTE  Date:  08/24/2016  Patient Details  Name: Paula Powell MRN: [id number] Date of Birth: 08-Nov-1932  Clinical Social Work is seeking post-discharge placement for this patient at the [place] level of care (*CSW will initial, date and re-position this form in  chart as items are completed):  Yes   Patient/family provided with [place] Work Department's list of facilities offering this level of care within the geographic area requested by the patient (or if unable, by the patient's family).  Yes   Patient/family informed of their freedom to choose among providers that offer the needed level of care, that participate in Medicare, Medicaid or managed care program needed by the patient, have an available bed and are willing to accept the patient.  Yes   Patient/family informed of [facility]'s ownership interest in [facility] and [facility], as well as of the fact that they are under no obligation to receive care at these facilities.  PASRR submitted to EDS on 08/21/16     PASRR number received on 08/21/16     Existing PASRR number confirmed on       FL2 transmitted to all facilities in geographic area requested by pt/family on 08/21/16     FL2 transmitted to all facilities within larger geographic area on       Patient informed that his/her managed care company has contracts with or will negotiate with certain facilities, including the following:        Yes   Patient/family informed of bed offers received.  Patient chooses bed at [facility]     Physician recommends and patient chooses bed at      Patient to be transferred to [facility] on 08/23/16.  Patient to be transferred to facility by [facility]     Patient family notified on 08/23/16 of transfer.  Name of family member notified:  RN notified family     PHYSICIAN       Additional Comment:     _______________________________________________ Darden Dates, LCSW 08/24/2016, 9:10 AM

## 2016-09-10 ENCOUNTER — Ambulatory Visit: Admit: 2016-09-10 | Payer: PPO | Admitting: Gastroenterology

## 2016-09-10 SURGERY — COLONOSCOPY WITH PROPOFOL
Anesthesia: General

## 2016-09-11 DIAGNOSIS — Z96651 Presence of right artificial knee joint: Secondary | ICD-10-CM | POA: Diagnosis not present

## 2016-09-11 DIAGNOSIS — I10 Essential (primary) hypertension: Secondary | ICD-10-CM | POA: Diagnosis not present

## 2016-09-11 DIAGNOSIS — F329 Major depressive disorder, single episode, unspecified: Secondary | ICD-10-CM | POA: Diagnosis not present

## 2016-09-11 DIAGNOSIS — M6281 Muscle weakness (generalized): Secondary | ICD-10-CM | POA: Diagnosis not present

## 2016-09-11 DIAGNOSIS — D649 Anemia, unspecified: Secondary | ICD-10-CM | POA: Diagnosis not present

## 2016-09-11 DIAGNOSIS — Z471 Aftercare following joint replacement surgery: Secondary | ICD-10-CM | POA: Diagnosis not present

## 2016-09-11 DIAGNOSIS — R2689 Other abnormalities of gait and mobility: Secondary | ICD-10-CM | POA: Diagnosis not present

## 2016-09-12 DIAGNOSIS — I10 Essential (primary) hypertension: Secondary | ICD-10-CM | POA: Diagnosis not present

## 2016-09-12 DIAGNOSIS — F329 Major depressive disorder, single episode, unspecified: Secondary | ICD-10-CM | POA: Diagnosis not present

## 2016-09-12 DIAGNOSIS — Z471 Aftercare following joint replacement surgery: Secondary | ICD-10-CM | POA: Diagnosis not present

## 2016-09-12 DIAGNOSIS — R2689 Other abnormalities of gait and mobility: Secondary | ICD-10-CM | POA: Diagnosis not present

## 2016-09-12 DIAGNOSIS — Z96651 Presence of right artificial knee joint: Secondary | ICD-10-CM | POA: Diagnosis not present

## 2016-09-12 DIAGNOSIS — M6281 Muscle weakness (generalized): Secondary | ICD-10-CM | POA: Diagnosis not present

## 2016-10-03 DIAGNOSIS — Z96651 Presence of right artificial knee joint: Secondary | ICD-10-CM | POA: Diagnosis not present

## 2016-11-07 DIAGNOSIS — L84 Corns and callosities: Secondary | ICD-10-CM | POA: Diagnosis not present

## 2016-11-14 DIAGNOSIS — R739 Hyperglycemia, unspecified: Secondary | ICD-10-CM | POA: Diagnosis not present

## 2016-11-14 DIAGNOSIS — Z79899 Other long term (current) drug therapy: Secondary | ICD-10-CM | POA: Diagnosis not present

## 2016-11-14 DIAGNOSIS — I10 Essential (primary) hypertension: Secondary | ICD-10-CM | POA: Diagnosis not present

## 2016-11-22 DIAGNOSIS — Z0000 Encounter for general adult medical examination without abnormal findings: Secondary | ICD-10-CM | POA: Diagnosis not present

## 2016-11-22 DIAGNOSIS — Z23 Encounter for immunization: Secondary | ICD-10-CM | POA: Diagnosis not present

## 2016-12-17 DIAGNOSIS — D2371 Other benign neoplasm of skin of right lower limb, including hip: Secondary | ICD-10-CM | POA: Diagnosis not present

## 2017-02-13 DIAGNOSIS — M25572 Pain in left ankle and joints of left foot: Secondary | ICD-10-CM | POA: Diagnosis not present

## 2017-03-11 DIAGNOSIS — Z8719 Personal history of other diseases of the digestive system: Secondary | ICD-10-CM | POA: Diagnosis not present

## 2017-03-29 DIAGNOSIS — J0100 Acute maxillary sinusitis, unspecified: Secondary | ICD-10-CM | POA: Diagnosis not present

## 2017-04-30 ENCOUNTER — Ambulatory Visit: Admit: 2017-04-30 | Payer: PPO | Admitting: Gastroenterology

## 2017-04-30 SURGERY — COLONOSCOPY WITH PROPOFOL
Anesthesia: General

## 2017-05-16 DIAGNOSIS — R739 Hyperglycemia, unspecified: Secondary | ICD-10-CM | POA: Diagnosis not present

## 2017-05-16 DIAGNOSIS — Z79899 Other long term (current) drug therapy: Secondary | ICD-10-CM | POA: Diagnosis not present

## 2017-05-16 DIAGNOSIS — I10 Essential (primary) hypertension: Secondary | ICD-10-CM | POA: Diagnosis not present

## 2017-05-16 DIAGNOSIS — R300 Dysuria: Secondary | ICD-10-CM | POA: Diagnosis not present

## 2017-05-16 DIAGNOSIS — R8290 Unspecified abnormal findings in urine: Secondary | ICD-10-CM | POA: Diagnosis not present

## 2017-05-23 DIAGNOSIS — F3341 Major depressive disorder, recurrent, in partial remission: Secondary | ICD-10-CM | POA: Diagnosis not present

## 2017-05-23 DIAGNOSIS — Z79899 Other long term (current) drug therapy: Secondary | ICD-10-CM | POA: Diagnosis not present

## 2017-05-23 DIAGNOSIS — J309 Allergic rhinitis, unspecified: Secondary | ICD-10-CM | POA: Diagnosis not present

## 2017-05-23 DIAGNOSIS — I10 Essential (primary) hypertension: Secondary | ICD-10-CM | POA: Diagnosis not present

## 2017-05-23 DIAGNOSIS — R739 Hyperglycemia, unspecified: Secondary | ICD-10-CM | POA: Diagnosis not present

## 2017-05-23 DIAGNOSIS — K5190 Ulcerative colitis, unspecified, without complications: Secondary | ICD-10-CM | POA: Diagnosis not present

## 2017-07-23 DIAGNOSIS — J0100 Acute maxillary sinusitis, unspecified: Secondary | ICD-10-CM | POA: Diagnosis not present

## 2017-07-23 DIAGNOSIS — J309 Allergic rhinitis, unspecified: Secondary | ICD-10-CM | POA: Diagnosis not present

## 2017-08-07 ENCOUNTER — Emergency Department: Payer: PPO

## 2017-08-07 ENCOUNTER — Other Ambulatory Visit: Payer: Self-pay

## 2017-08-07 ENCOUNTER — Emergency Department
Admission: EM | Admit: 2017-08-07 | Discharge: 2017-08-07 | Disposition: A | Discharge status: Home / Self Care | Payer: PPO | Attending: Emergency Medicine | Admitting: Emergency Medicine

## 2017-08-07 DIAGNOSIS — Z7901 Long term (current) use of anticoagulants: Secondary | ICD-10-CM | POA: Diagnosis not present

## 2017-08-07 DIAGNOSIS — Z79899 Other long term (current) drug therapy: Secondary | ICD-10-CM | POA: Insufficient documentation

## 2017-08-07 DIAGNOSIS — M1612 Unilateral primary osteoarthritis, left hip: Secondary | ICD-10-CM | POA: Insufficient documentation

## 2017-08-07 DIAGNOSIS — M25552 Pain in left hip: Secondary | ICD-10-CM | POA: Diagnosis not present

## 2017-08-07 DIAGNOSIS — Z96651 Presence of right artificial knee joint: Secondary | ICD-10-CM | POA: Insufficient documentation

## 2017-08-07 DIAGNOSIS — F17210 Nicotine dependence, cigarettes, uncomplicated: Secondary | ICD-10-CM | POA: Insufficient documentation

## 2017-08-07 DIAGNOSIS — I10 Essential (primary) hypertension: Secondary | ICD-10-CM | POA: Diagnosis not present

## 2017-08-07 MED ORDER — TRAMADOL HCL 50 MG PO TABS
50.0000 mg | ORAL_TABLET | Freq: Once | ORAL | Status: AC
  Administered 2017-08-07: 50 mg via ORAL
  Filled 2017-08-07: qty 1

## 2017-08-07 MED ORDER — TRAMADOL HCL 50 MG PO TABS: 50.0000 mg | ORAL_TABLET | Freq: Four times a day (QID) | ORAL | 0 refills | Status: DC | PRN

## 2017-08-07 NOTE — Discharge Instructions (Signed)
Your hip xray was okay today. Follow up with your doctor for further evaluation of your leg pain. Take tramadol as needed.

## 2017-08-07 NOTE — ED Provider Notes (Signed)
[facility] Emergency Department Provider Note  ____________________________________________  Time seen: Approximately 6:31 AM  I have reviewed the triage vital signs and the nursing notes.   HISTORY  Chief Complaint Hip Pain (Left Side) and Leg Pain (Left Side)    HPI Paula Powell is a 84 y.o. female with a history of hypertension IBD who complains of left posterior thigh pain radiating down the leg.  No trauma.  Denies back pain.  No fevers or chills.  No recent falls or injuries.  No weakness paresthesias.  Pain is severe, constant, waxing waning, worse with movement.  She reports that there is certain positions that are more tolerable and others that are more severe.  She does report a history of spinal stenosis, has seen neurosurgery in [place] at [facility].      Past Medical History:  Diagnosis Date  . Allergic eczema   . Anemia   . Carpal tunnel syndrome   . Chest pain, atypical   . Depression   . Depressive disorder   . Diverticulosis   . DJD (degenerative joint disease)   . Eczema   . Essential hypertension   . H/O: hysterectomy   . Hives   . IBD (inflammatory bowel disease)   . Irritable bowel syndrome   . PONV (postoperative nausea and vomiting)    left knee arthroscopy  . Rhinitis, allergic   . Right carotid bruit      Patient Active Problem List   Diagnosis Date Noted  . Primary osteoarthritis of right knee 08/21/2016     Past Surgical History:  Procedure Laterality Date  . ABDOMINAL HYSTERECTOMY    . BACK SURGERY  2008   [facility]  . CARPAL TUNNEL RELEASE Left 1990's  . COLONOSCOPY WITH PROPOFOL N/A 07/22/2014   Procedure: COLONOSCOPY WITH PROPOFOL;  Surgeon: Hulen Luster, MD;  Location: [facility] ENDOSCOPY;  Service: Gastroenterology;  Laterality: N/A;  . JOINT REPLACEMENT    . OOPHORECTOMY Right 2003  . RECTAL SURGERY  2003  . TOTAL KNEE ARTHROPLASTY Right 08/21/2016   Procedure: TOTAL KNEE ARTHROPLASTY;  Surgeon: Hessie Knows, MD;  Location: [facility] ORS;  Service: Orthopedics;  Laterality: Right;     Prior to Admission medications   Medication Sig Start Date End Date Taking? Authorizing Provider  acetaminophen (TYLENOL) 500 MG tablet Take 1,000 mg by mouth every 6 (six) hours as needed for mild pain.    [provider]  citalopram (CELEXA) 20 MG tablet Take 30 mg by mouth daily. In am. 05/21/16   [provider]  cyclobenzaprine (FLEXERIL) 10 MG tablet Take 10 mg by mouth 3 (three) times daily as needed for muscle spasms.    [provider]  enoxaparin (LOVENOX) 40 MG/0.4ML injection Inject 0.4 mLs (40 mg total) into the skin daily. 08/23/16 09/06/16  Duanne Guess, PA-C  hydrochlorothiazide (HYDRODIURIL) 25 MG tablet Take 25 mg by mouth daily. In am.    [provider]  losartan (COZAAR) 100 MG tablet Take 100 mg by mouth daily. In am.    [provider]  mesalamine (LIALDA) 1.2 g EC tablet Take 4 tablets by mouth daily with breakfast.     [provider]  oxyCODONE (OXY IR/ROXICODONE) 5 MG immediate release tablet Take 1-2 tablets (5-10 mg total) by mouth every 3 (three) hours as needed for breakthrough pain. 08/23/16   Duanne Guess, PA-C  traMADol (ULTRAM) 50 MG tablet Take 1 tablet (50 mg total) by mouth every 6 (  six) hours as needed. 08/07/17   Carrie Mew, MD     Allergies Ace inhibitors   Family History  Problem Relation Age of Onset  . Breast cancer Paternal Grandmother     Social History Social History   Tobacco Use  . Smoking status: Current Every Day Smoker    Packs/day: 1.00  . Smokeless tobacco: Never Used  Substance Use Topics  . Alcohol use: No  . Drug use: No    Review of Systems  Constitutional:   No fever or chills.  Cardiovascular:   No chest pain or syncope. Respiratory:   No dyspnea or cough. Gastrointestinal:   Negative for abdominal pain, vomiting and diarrhea.  Musculoskeletal:   Left hip pain as  above All other systems reviewed and are negative except as documented above in ROS and HPI.  ____________________________________________   PHYSICAL EXAM:  VITAL SIGNS: ED Triage Vitals  Enc Vitals Group     BP 08/07/17 0548 (!) 163/84     Pulse Rate 08/07/17 0548 93     Resp 08/07/17 0548 17     Temp 08/07/17 0548 98.3 F (36.8 C)     Temp Source 08/07/17 0548 Oral     SpO2 08/07/17 0548 93 %     Weight 08/07/17 0555 184 lb (83.5 kg)     Height 08/07/17 0555 5\' 6"  (1.676 m)     Head Circumference --      Peak Flow --      Pain Score 08/07/17 0549 5     Pain Loc --      Pain Edu? --      Excl. in [place]? --     Vital signs reviewed, nursing assessments reviewed.   Constitutional:   Alert and oriented. Non-toxic appearance. Eyes:   Conjunctivae are normal. EOMI. PERRL. ENT      Head:   Normocephalic and atraumatic.      Nose:   No congestion/rhinnorhea.       Mouth/Throat:   MMM, no pharyngeal erythema. No peritonsillar mass.       Neck:   No meningismus. Full ROM. Hematological/Lymphatic/Immunilogical:   No cervical lymphadenopathy. Cardiovascular:   RRR. Symmetric bilateral radial and DP pulses.  No murmurs.  Respiratory:   Normal respiratory effort without tachypnea/retractions. Breath sounds are clear and equal bilaterally. No wheezes/rales/rhonchi. Gastrointestinal:   Soft and nontender. Non distended. There is no CVA tenderness.  No rebound, rigidity, or guarding. Genitourinary:   deferred Musculoskeletal:   Normal range of motion in all extremities. No joint effusions.  No lower extremity tenderness.  No edema.  No midline spinal tenderness.  Passive leg raise negative. Neurologic:   Normal speech and language.  Motor grossly intact. No acute focal neurologic deficits are appreciated.  Skin:    Skin is warm, dry and intact. No rash noted.  No petechiae, purpura, or bullae.  ____________________________________________    LABS (pertinent  positives/negatives) (all labs ordered are listed, but only abnormal results are displayed) Labs Reviewed - No data to display ____________________________________________   EKG    ____________________________________________    RADIOLOGY  Dg Hip Unilat W Or Wo Pelvis 2-3 Views Left  Result Date: 08/07/2017 CLINICAL DATA:  Hip pain for several days, no known injury, initial encounter EXAM: DG HIP (WITH OR WITHOUT PELVIS) 3V LEFT COMPARISON:  None. FINDINGS: Pelvic ring is intact. Mild degenerative changes of the hip joints are noted bilaterally. No acute fracture or dislocation is seen. No soft tissue abnormality is noted.  IMPRESSION: Degenerative change without acute abnormality. Electronically Signed   By: Inez Catalina M.D.   On: 08/07/2017 07:03    ____________________________________________   PROCEDURES Procedures  ____________________________________________    CLINICAL IMPRESSION / ASSESSMENT AND PLAN / ED COURSE  Pertinent labs & imaging results that were available during my care of the patient were reviewed by me and considered in my medical decision making (see chart for details).    Patient well-appearing, nontoxic.  Vital signs unremarkable.  Presents with left hip and thigh pain.  Exam is benign.  Due to her age and the lack of any other apparent explanation I will get x-rays of the pelvis and hip to ensure she does not have a fracture.  Most likely I think this is evolution of chronic severe degenerative arthritis.  I will give her tramadol, recommended she will need to follow-up with her primary care doctor for physical therapy evaluation.   ----------------------------------------- 7:15 AM on 08/07/2017 -----------------------------------------  X-ray unremarkable.  Pain controlled, vital signs normal.  Suitable for discharge home, follow-up with her primary care doctor and orthopedic surgeon.     ____________________________________________   FINAL  CLINICAL IMPRESSION(S) / ED DIAGNOSES    Final diagnoses:  Left hip pain  Osteoarthritis of left hip, unspecified osteoarthritis type     ED Discharge Orders        Ordered    traMADol (ULTRAM) 50 MG tablet  Every 6 hours PRN     08/07/17 0714      Portions of this note were generated with dragon dictation software. Dictation errors may occur despite best attempts at proofreading.    Carrie Mew, MD 08/07/17 [phone]

## 2017-08-07 NOTE — ED Notes (Signed)
Pt getting xrays at this time.

## 2017-08-07 NOTE — ED Triage Notes (Signed)
Pt from home by EMS with reports of left hip and leg pain that began 4 days ago. Pt denies inj. Pt states pain is like a "hot poker" sticking her side. Pt A&O at this time.

## 2017-08-08 DIAGNOSIS — M5432 Sciatica, left side: Secondary | ICD-10-CM | POA: Diagnosis not present

## 2017-08-08 DIAGNOSIS — K5190 Ulcerative colitis, unspecified, without complications: Secondary | ICD-10-CM | POA: Diagnosis not present

## 2017-11-22 DIAGNOSIS — Z79899 Other long term (current) drug therapy: Secondary | ICD-10-CM | POA: Diagnosis not present

## 2017-11-22 DIAGNOSIS — I10 Essential (primary) hypertension: Secondary | ICD-10-CM | POA: Diagnosis not present

## 2017-11-22 DIAGNOSIS — R739 Hyperglycemia, unspecified: Secondary | ICD-10-CM | POA: Diagnosis not present

## 2017-11-25 DIAGNOSIS — Z0000 Encounter for general adult medical examination without abnormal findings: Secondary | ICD-10-CM | POA: Diagnosis not present

## 2017-11-25 DIAGNOSIS — Z23 Encounter for immunization: Secondary | ICD-10-CM | POA: Diagnosis not present

## 2017-12-23 DIAGNOSIS — N390 Urinary tract infection, site not specified: Secondary | ICD-10-CM | POA: Diagnosis not present

## 2017-12-23 DIAGNOSIS — J069 Acute upper respiratory infection, unspecified: Secondary | ICD-10-CM | POA: Diagnosis not present

## 2017-12-23 DIAGNOSIS — R350 Frequency of micturition: Secondary | ICD-10-CM | POA: Diagnosis not present

## 2018-04-17 DIAGNOSIS — R300 Dysuria: Secondary | ICD-10-CM | POA: Diagnosis not present

## 2018-04-17 DIAGNOSIS — N390 Urinary tract infection, site not specified: Secondary | ICD-10-CM | POA: Diagnosis not present

## 2018-05-02 IMAGING — DX DG KNEE 1-2V*R*
2 series · 2 of 2 positions shown · non-contrast
Comparison: 07/25/2016

CLINICAL DATA: Total knee arthroplasty

EXAM:
RIGHT KNEE - 1-2 VIEW

[knee ap]
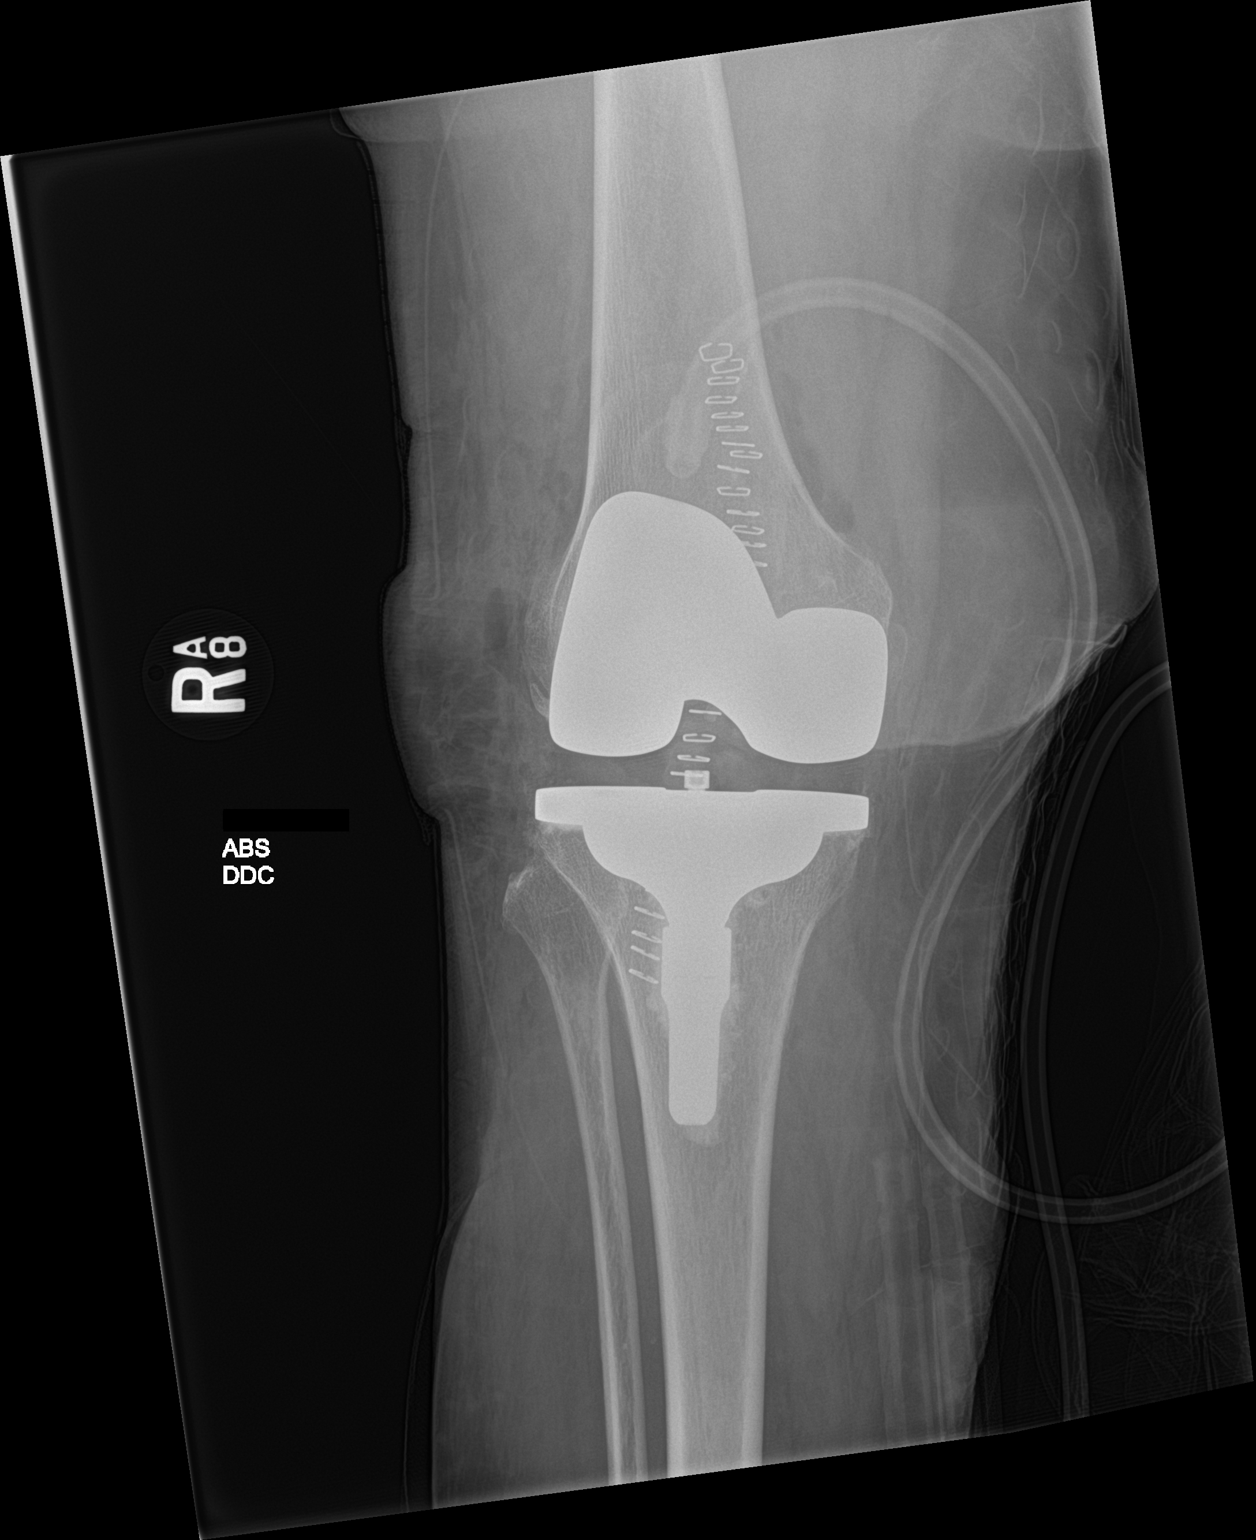

[knee lat]
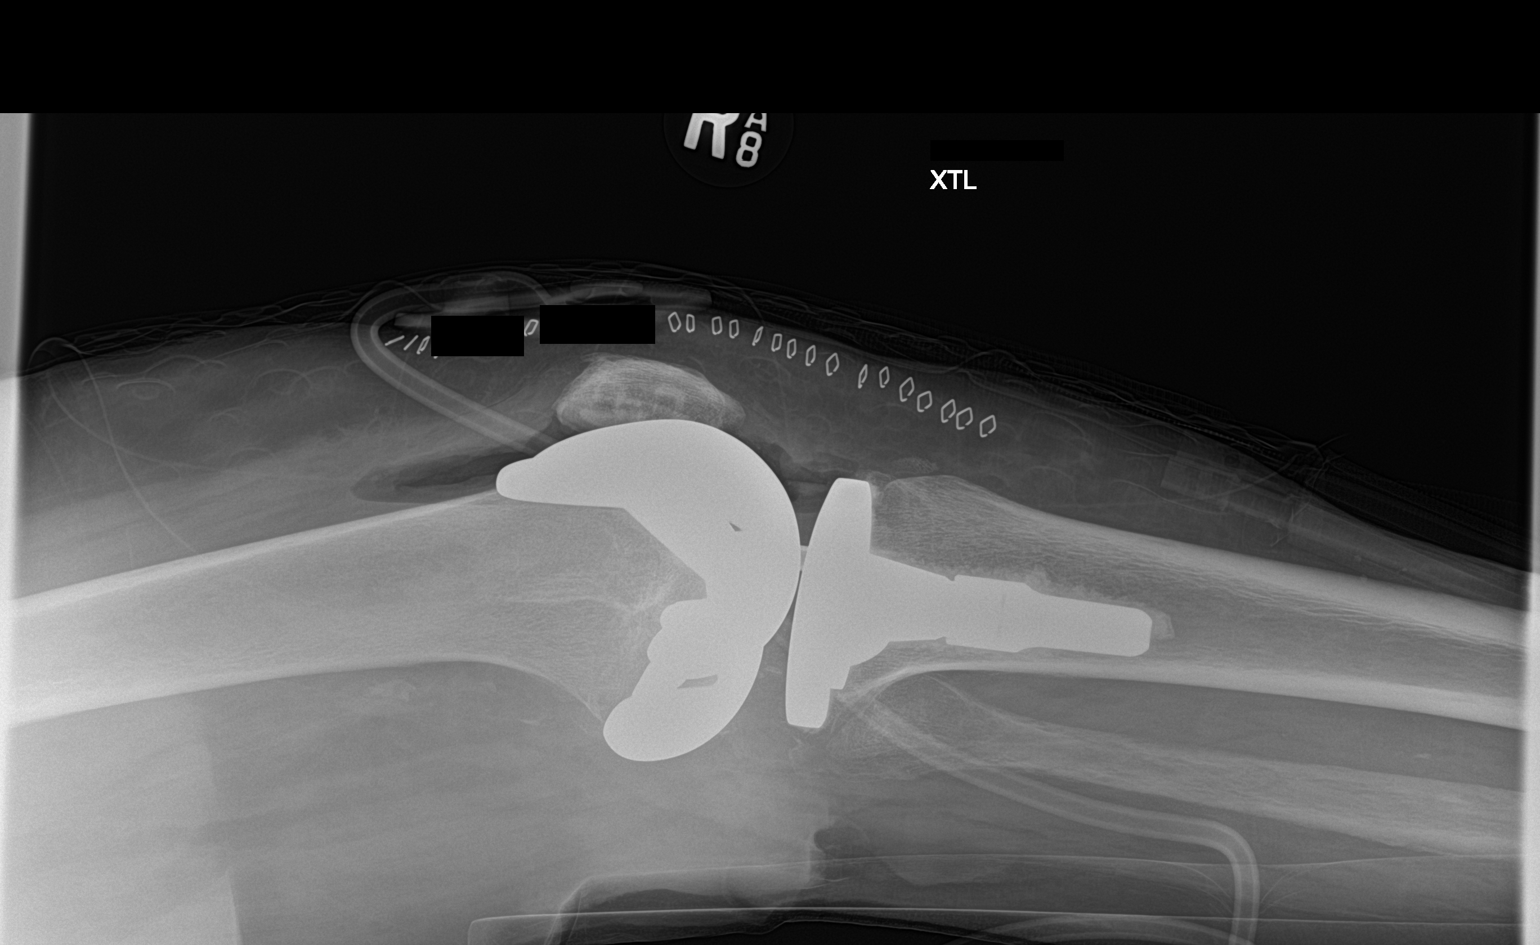

[2 of 2 positions shown; findings below may reference images not displayed]

FINDINGS: Torn knee arthroplasty has been performed. There is anatomic
alignment. No breakage or loosening of the hardware. No fracture.
IMPRESSION: Total knee arthroplasty anatomically aligned.

## 2018-05-05 IMAGING — CT CT ANGIO CHEST
2 of 6 series · 17 of 46 positions shown · IV contrast (APPLIED)
Comparison: No priors.

CLINICAL DATA: 83-year-old female with history of shortness of
breath and hypoxia. Recent knee surgery 3 days ago. Cough today.

EXAM:
CT ANGIOGRAPHY CHEST WITH CONTRAST
TECHNIQUE: Multidetector CT imaging of the chest was performed using the
standard protocol during bolus administration of intravenous
contrast. Multiplanar CT image reconstructions and MIPs were
obtained to evaluate the vascular anatomy.
CONTRAST:  75 mL of Isovue 370.

[Series 12: thins · axial · 0.65mm/px · z∈[-568,-337]mm · 15 of 255 slices shown]
[im 12/255  lung]
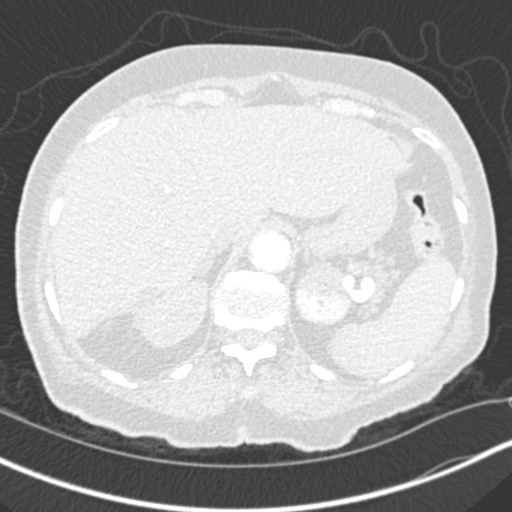
[im 34/255  soft-tissue]
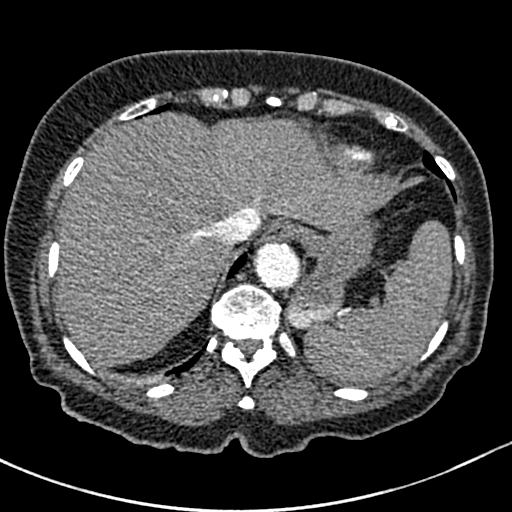
[im 45/255  lung]
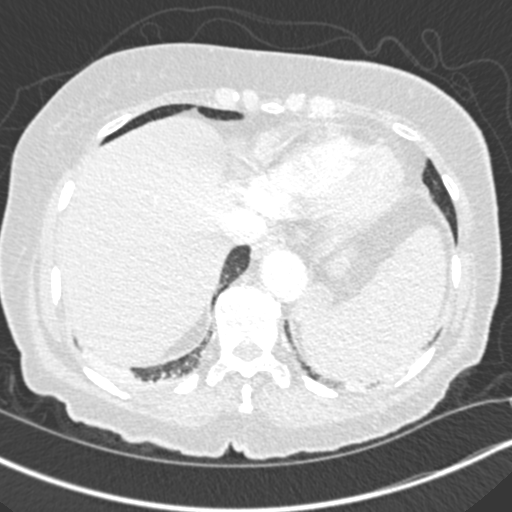
[im 67/255  soft-tissue]
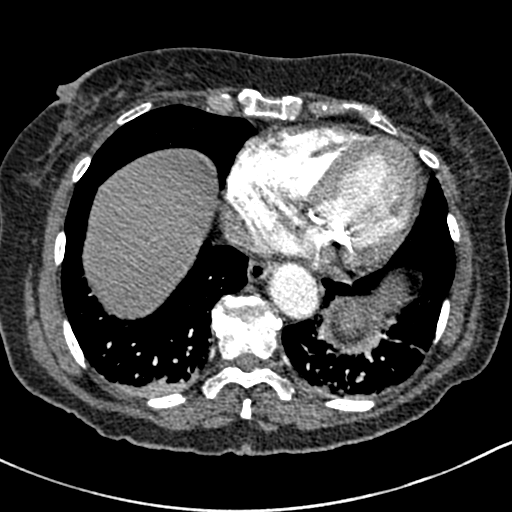
[im 78/255  lung]
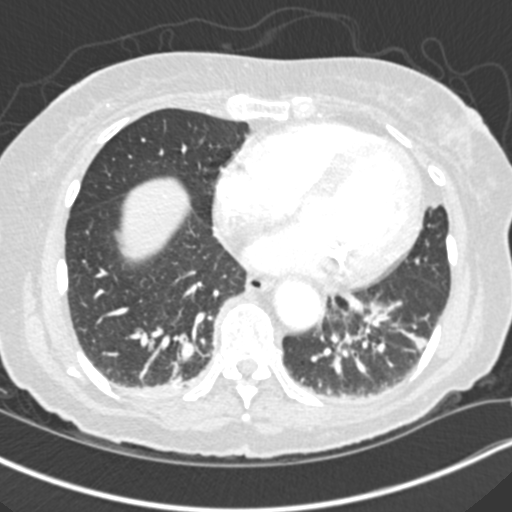
[im 100/255  soft-tissue]
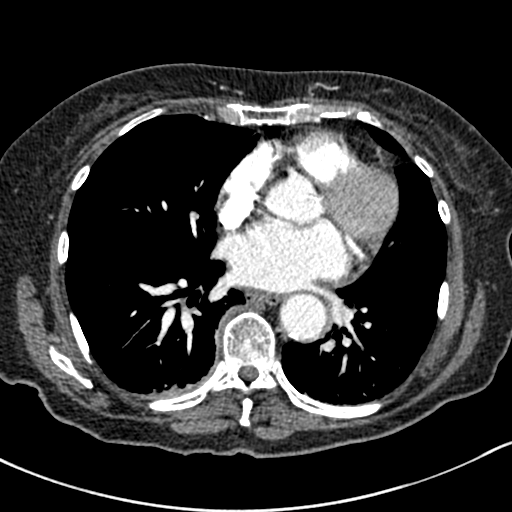
[im 111/255  lung]
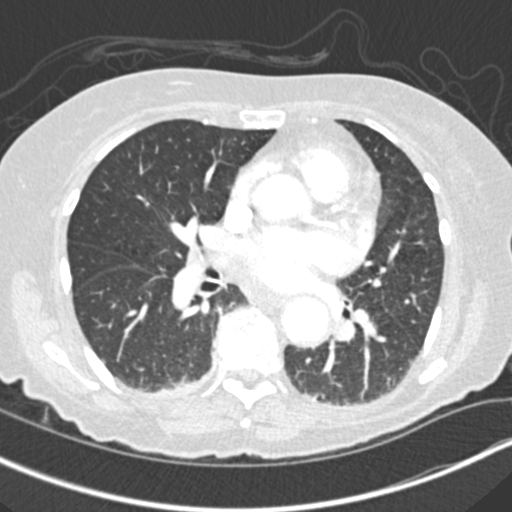
[im 133/255  soft-tissue]
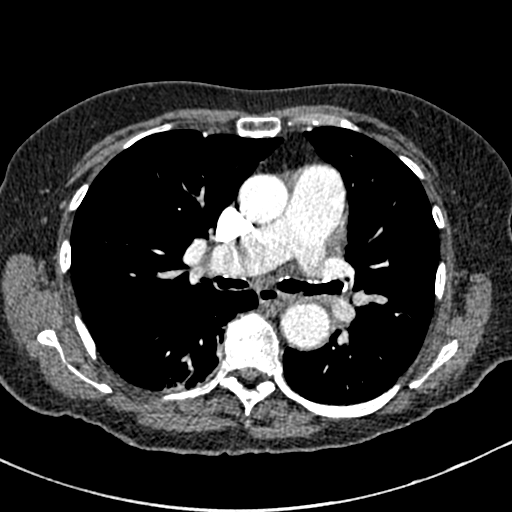
[im 144/255  lung]
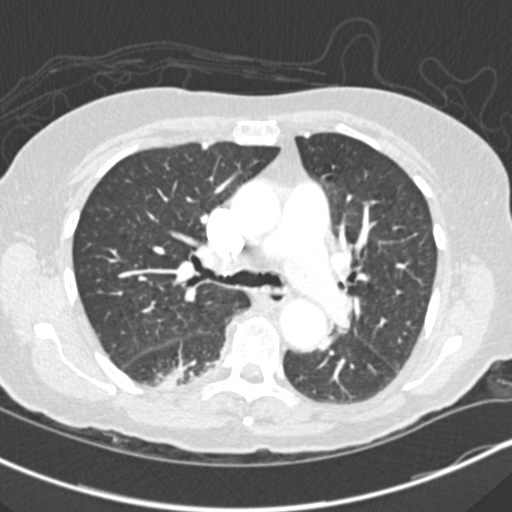
[im 155/255  soft-tissue]
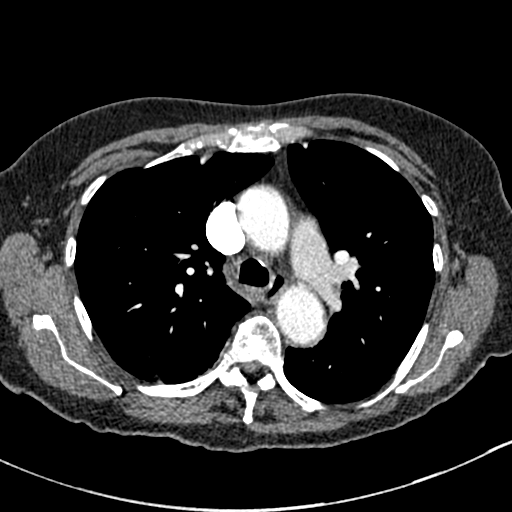
[im 177/255  lung]
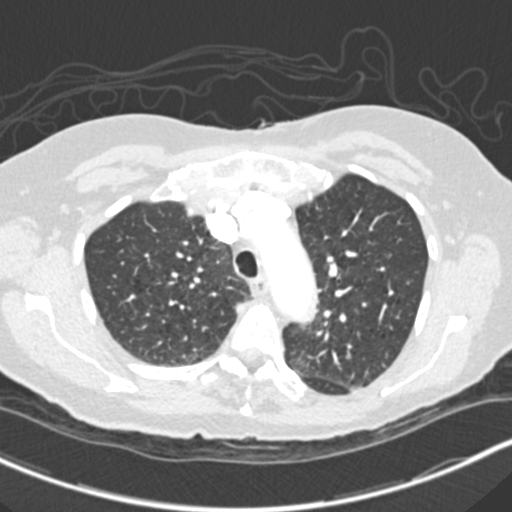
[im 188/255  soft-tissue]
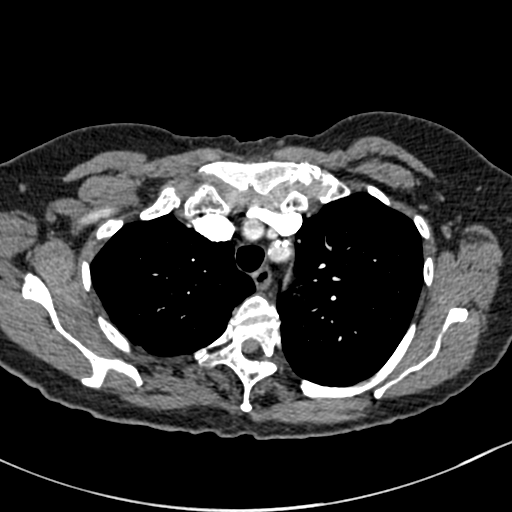
[im 210/255  lung]
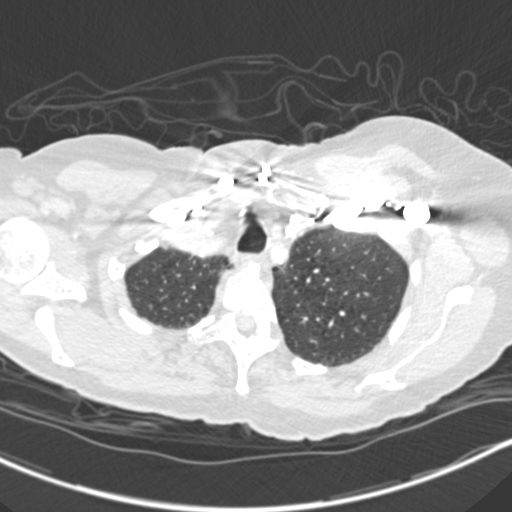
[im 221/255  soft-tissue]
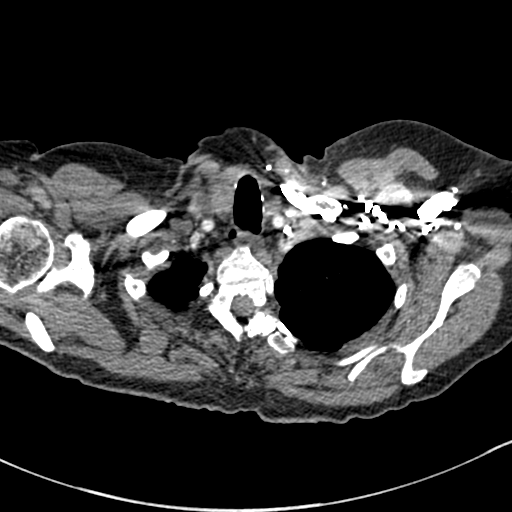
[im 243/255  lung]
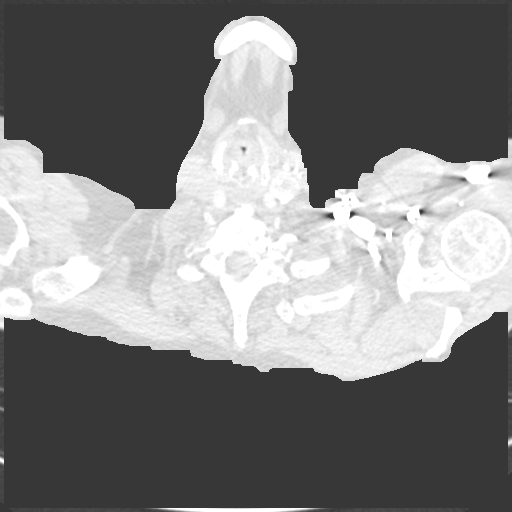

[Series 13: coronal mpr · coronal · 0.50mm/px · 2 of 83 slices shown]
[im 28/83  soft-tissue]
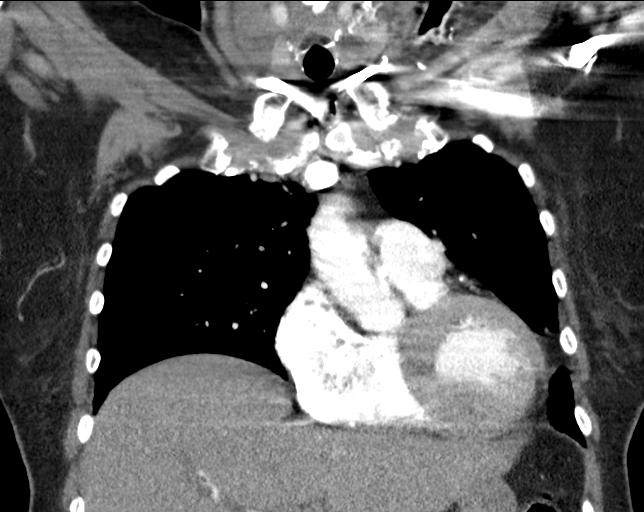
[im 55/83  soft-tissue]
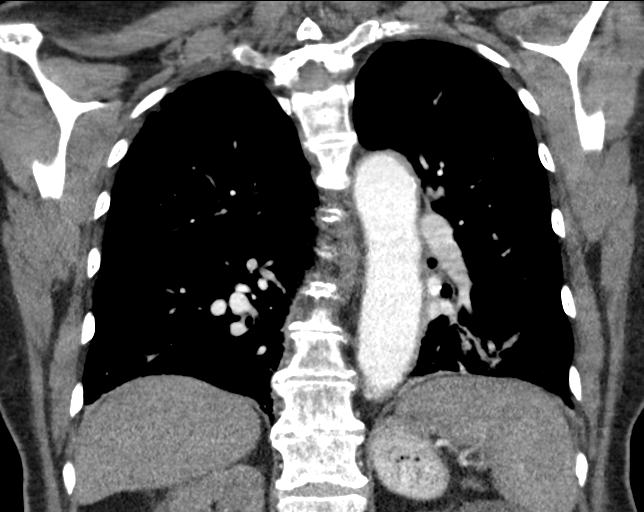

[17 of 46 positions shown; findings below may reference images not displayed]

FINDINGS: Cardiovascular: No filling defects within the pulmonary arterial
tree to suggest underlying pulmonary embolism. Heart size is
borderline enlarged. There is no significant pericardial fluid,
thickening or pericardial calcification. There is aortic
atherosclerosis, as well as atherosclerosis of the great vessels of
the mediastinum and the coronary arteries, including calcified
atherosclerotic plaque in the left main, left anterior descending
and right coronary arteries. Thickening calcification of the aortic
valve. Calcification of the mitral annulus.

Mediastinum/Nodes: No pathologically enlarged mediastinal or hilar
lymph nodes. Esophagus is unremarkable in appearance. No axillary
lymphadenopathy.

Lungs/Pleura: Mild diffuse bronchial wall thickening with mild
centrilobular and paraseptal emphysema. No acute consolidative
airspace disease. Dependent areas of subsegmental atelectasis and/or
scarring are noted in the lung bases bilaterally. No suspicious
appearing pulmonary nodules or masses are noted.

Upper Abdomen: Exophytic incompletely visualized low-attenuation
lesion extending off the upper pole of the right kidney measuring at
least 2.2 cm in diameter is incompletely characterize, but
statistically likely a cyst. Large diverticulum noted extending off
the posterior aspect of the gastric cardia. Aortic atherosclerosis.

Musculoskeletal: There are no aggressive appearing lytic or blastic
lesions noted in the visualized portions of the skeleton.

Review of the MIP images confirms the above findings.
IMPRESSION: 1. No evidence of pulmonary embolism.
2. No acute findings in the thorax to account for the patient's
symptoms.
3. Aortic atherosclerosis, in addition to left main and 2 vessel
coronary artery disease.
4. There are calcifications of the aortic valve and mitral annulus.
Echocardiographic correlation for evaluation of potential valvular
dysfunction may be warranted if clinically indicated.
5. Mild diffuse bronchial wall thickening with mild centrilobular
and paraseptal emphysema; imaging findings suggestive of underlying
COPD.
6. Additional incidental findings, as above.

Aortic Atherosclerosis (ZQXIV-E57.7) and Emphysema (ZQXIV-CFH.2).

## 2018-05-20 DIAGNOSIS — I10 Essential (primary) hypertension: Secondary | ICD-10-CM | POA: Diagnosis not present

## 2018-05-20 DIAGNOSIS — N183 Chronic kidney disease, stage 3 (moderate): Secondary | ICD-10-CM | POA: Diagnosis not present

## 2018-07-15 DIAGNOSIS — I10 Essential (primary) hypertension: Secondary | ICD-10-CM | POA: Diagnosis not present

## 2018-07-15 DIAGNOSIS — F3341 Major depressive disorder, recurrent, in partial remission: Secondary | ICD-10-CM | POA: Diagnosis not present

## 2018-07-15 DIAGNOSIS — N183 Chronic kidney disease, stage 3 (moderate): Secondary | ICD-10-CM | POA: Diagnosis not present

## 2018-07-15 DIAGNOSIS — Z79899 Other long term (current) drug therapy: Secondary | ICD-10-CM | POA: Diagnosis not present

## 2018-10-06 DIAGNOSIS — N390 Urinary tract infection, site not specified: Secondary | ICD-10-CM | POA: Diagnosis not present

## 2018-10-06 DIAGNOSIS — R300 Dysuria: Secondary | ICD-10-CM | POA: Diagnosis not present

## 2018-10-06 DIAGNOSIS — F331 Major depressive disorder, recurrent, moderate: Secondary | ICD-10-CM | POA: Diagnosis not present

## 2018-10-06 DIAGNOSIS — I10 Essential (primary) hypertension: Secondary | ICD-10-CM | POA: Diagnosis not present

## 2018-10-06 DIAGNOSIS — F419 Anxiety disorder, unspecified: Secondary | ICD-10-CM | POA: Diagnosis not present

## 2018-10-06 DIAGNOSIS — K5190 Ulcerative colitis, unspecified, without complications: Secondary | ICD-10-CM | POA: Diagnosis not present

## 2018-10-06 DIAGNOSIS — N183 Chronic kidney disease, stage 3 (moderate): Secondary | ICD-10-CM | POA: Diagnosis not present

## 2018-12-23 DIAGNOSIS — K5792 Diverticulitis of intestine, part unspecified, without perforation or abscess without bleeding: Secondary | ICD-10-CM | POA: Diagnosis not present

## 2019-01-19 DIAGNOSIS — I10 Essential (primary) hypertension: Secondary | ICD-10-CM | POA: Diagnosis not present

## 2019-01-19 DIAGNOSIS — Z79899 Other long term (current) drug therapy: Secondary | ICD-10-CM | POA: Diagnosis not present

## 2019-01-19 DIAGNOSIS — N183 Chronic kidney disease, stage 3 unspecified: Secondary | ICD-10-CM | POA: Diagnosis not present

## 2019-01-26 DIAGNOSIS — Z23 Encounter for immunization: Secondary | ICD-10-CM | POA: Diagnosis not present

## 2019-01-26 DIAGNOSIS — Z0000 Encounter for general adult medical examination without abnormal findings: Secondary | ICD-10-CM | POA: Diagnosis not present

## 2019-02-04 DIAGNOSIS — M79675 Pain in left toe(s): Secondary | ICD-10-CM | POA: Diagnosis not present

## 2019-02-04 DIAGNOSIS — M79674 Pain in right toe(s): Secondary | ICD-10-CM | POA: Diagnosis not present

## 2019-02-04 DIAGNOSIS — B351 Tinea unguium: Secondary | ICD-10-CM | POA: Diagnosis not present

## 2019-02-04 DIAGNOSIS — M898X9 Other specified disorders of bone, unspecified site: Secondary | ICD-10-CM | POA: Diagnosis not present

## 2019-07-24 DIAGNOSIS — I10 Essential (primary) hypertension: Secondary | ICD-10-CM | POA: Diagnosis not present

## 2019-07-24 DIAGNOSIS — Z79899 Other long term (current) drug therapy: Secondary | ICD-10-CM | POA: Diagnosis not present

## 2019-07-30 DIAGNOSIS — N1831 Chronic kidney disease, stage 3a: Secondary | ICD-10-CM | POA: Diagnosis not present

## 2019-07-30 DIAGNOSIS — I10 Essential (primary) hypertension: Secondary | ICD-10-CM | POA: Diagnosis not present

## 2019-07-30 DIAGNOSIS — K5190 Ulcerative colitis, unspecified, without complications: Secondary | ICD-10-CM | POA: Diagnosis not present

## 2019-07-30 DIAGNOSIS — F3341 Major depressive disorder, recurrent, in partial remission: Secondary | ICD-10-CM | POA: Diagnosis not present

## 2019-07-30 DIAGNOSIS — Z79899 Other long term (current) drug therapy: Secondary | ICD-10-CM | POA: Diagnosis not present

## 2020-01-26 DIAGNOSIS — Z79899 Other long term (current) drug therapy: Secondary | ICD-10-CM | POA: Diagnosis not present

## 2020-01-26 DIAGNOSIS — R8290 Unspecified abnormal findings in urine: Secondary | ICD-10-CM | POA: Diagnosis not present

## 2020-01-26 DIAGNOSIS — N1831 Chronic kidney disease, stage 3a: Secondary | ICD-10-CM | POA: Diagnosis not present

## 2020-01-26 DIAGNOSIS — R350 Frequency of micturition: Secondary | ICD-10-CM | POA: Diagnosis not present

## 2020-01-26 DIAGNOSIS — I10 Essential (primary) hypertension: Secondary | ICD-10-CM | POA: Diagnosis not present

## 2020-02-01 DIAGNOSIS — N1831 Chronic kidney disease, stage 3a: Secondary | ICD-10-CM | POA: Diagnosis not present

## 2020-02-01 DIAGNOSIS — I10 Essential (primary) hypertension: Secondary | ICD-10-CM | POA: Diagnosis not present

## 2020-02-01 DIAGNOSIS — K5190 Ulcerative colitis, unspecified, without complications: Secondary | ICD-10-CM | POA: Diagnosis not present

## 2020-02-01 DIAGNOSIS — Z0000 Encounter for general adult medical examination without abnormal findings: Secondary | ICD-10-CM | POA: Diagnosis not present

## 2020-02-01 DIAGNOSIS — F3341 Major depressive disorder, recurrent, in partial remission: Secondary | ICD-10-CM | POA: Diagnosis not present

## 2020-02-01 DIAGNOSIS — Z23 Encounter for immunization: Secondary | ICD-10-CM | POA: Diagnosis not present

## 2020-04-11 DIAGNOSIS — Q6689 Other  specified congenital deformities of feet: Secondary | ICD-10-CM | POA: Diagnosis not present

## 2020-04-11 DIAGNOSIS — M778 Other enthesopathies, not elsewhere classified: Secondary | ICD-10-CM | POA: Diagnosis not present

## 2020-04-29 DIAGNOSIS — R102 Pelvic and perineal pain: Secondary | ICD-10-CM | POA: Diagnosis not present

## 2020-04-29 DIAGNOSIS — N390 Urinary tract infection, site not specified: Secondary | ICD-10-CM | POA: Diagnosis not present

## 2020-05-13 DIAGNOSIS — N1831 Chronic kidney disease, stage 3a: Secondary | ICD-10-CM | POA: Diagnosis not present

## 2020-05-13 DIAGNOSIS — R002 Palpitations: Secondary | ICD-10-CM | POA: Diagnosis not present

## 2020-05-13 DIAGNOSIS — R1084 Generalized abdominal pain: Secondary | ICD-10-CM | POA: Diagnosis not present

## 2020-05-13 DIAGNOSIS — N390 Urinary tract infection, site not specified: Secondary | ICD-10-CM | POA: Diagnosis not present

## 2020-05-15 ENCOUNTER — Inpatient Hospital Stay
Admission: EM | Admit: 2020-05-15 | Discharge: 2020-05-19 | DRG: 291 | Disposition: A | Discharge status: Skilled Nursing Facility | Payer: PPO | Attending: Internal Medicine | Admitting: Internal Medicine

## 2020-05-15 ENCOUNTER — Emergency Department: Payer: PPO

## 2020-05-15 ENCOUNTER — Encounter: Payer: Self-pay | Admitting: Emergency Medicine

## 2020-05-15 ENCOUNTER — Other Ambulatory Visit: Payer: Self-pay

## 2020-05-15 DIAGNOSIS — F17209 Nicotine dependence, unspecified, with unspecified nicotine-induced disorders: Secondary | ICD-10-CM | POA: Diagnosis not present

## 2020-05-15 DIAGNOSIS — I10 Essential (primary) hypertension: Secondary | ICD-10-CM | POA: Diagnosis present

## 2020-05-15 DIAGNOSIS — I248 Other forms of acute ischemic heart disease: Secondary | ICD-10-CM | POA: Diagnosis not present

## 2020-05-15 DIAGNOSIS — Z881 Allergy status to other antibiotic agents status: Secondary | ICD-10-CM

## 2020-05-15 DIAGNOSIS — N1831 Chronic kidney disease, stage 3a: Secondary | ICD-10-CM | POA: Diagnosis not present

## 2020-05-15 DIAGNOSIS — Z20822 Contact with and (suspected) exposure to covid-19: Secondary | ICD-10-CM | POA: Diagnosis present

## 2020-05-15 DIAGNOSIS — N1832 Chronic kidney disease, stage 3b: Secondary | ICD-10-CM | POA: Diagnosis not present

## 2020-05-15 DIAGNOSIS — F329 Major depressive disorder, single episode, unspecified: Secondary | ICD-10-CM | POA: Diagnosis not present

## 2020-05-15 DIAGNOSIS — E785 Hyperlipidemia, unspecified: Secondary | ICD-10-CM | POA: Diagnosis not present

## 2020-05-15 DIAGNOSIS — L309 Dermatitis, unspecified: Secondary | ICD-10-CM | POA: Diagnosis present

## 2020-05-15 DIAGNOSIS — F17200 Nicotine dependence, unspecified, uncomplicated: Secondary | ICD-10-CM | POA: Diagnosis not present

## 2020-05-15 DIAGNOSIS — K5790 Diverticulosis of intestine, part unspecified, without perforation or abscess without bleeding: Secondary | ICD-10-CM | POA: Diagnosis present

## 2020-05-15 DIAGNOSIS — Z803 Family history of malignant neoplasm of breast: Secondary | ICD-10-CM | POA: Diagnosis not present

## 2020-05-15 DIAGNOSIS — K529 Noninfective gastroenteritis and colitis, unspecified: Secondary | ICD-10-CM | POA: Diagnosis not present

## 2020-05-15 DIAGNOSIS — I5031 Acute diastolic (congestive) heart failure: Secondary | ICD-10-CM | POA: Diagnosis not present

## 2020-05-15 DIAGNOSIS — R059 Cough, unspecified: Secondary | ICD-10-CM | POA: Diagnosis not present

## 2020-05-15 DIAGNOSIS — N390 Urinary tract infection, site not specified: Secondary | ICD-10-CM | POA: Diagnosis not present

## 2020-05-15 DIAGNOSIS — I700 Atherosclerosis of aorta: Secondary | ICD-10-CM | POA: Diagnosis not present

## 2020-05-15 DIAGNOSIS — K589 Irritable bowel syndrome without diarrhea: Secondary | ICD-10-CM | POA: Diagnosis not present

## 2020-05-15 DIAGNOSIS — F331 Major depressive disorder, recurrent, moderate: Secondary | ICD-10-CM | POA: Diagnosis not present

## 2020-05-15 DIAGNOSIS — I2510 Atherosclerotic heart disease of native coronary artery without angina pectoris: Secondary | ICD-10-CM | POA: Diagnosis not present

## 2020-05-15 DIAGNOSIS — I130 Hypertensive heart and chronic kidney disease with heart failure and stage 1 through stage 4 chronic kidney disease, or unspecified chronic kidney disease: Secondary | ICD-10-CM | POA: Diagnosis not present

## 2020-05-15 DIAGNOSIS — D631 Anemia in chronic kidney disease: Secondary | ICD-10-CM | POA: Diagnosis not present

## 2020-05-15 DIAGNOSIS — Z79899 Other long term (current) drug therapy: Secondary | ICD-10-CM | POA: Diagnosis not present

## 2020-05-15 DIAGNOSIS — F17210 Nicotine dependence, cigarettes, uncomplicated: Secondary | ICD-10-CM | POA: Diagnosis not present

## 2020-05-15 DIAGNOSIS — G5600 Carpal tunnel syndrome, unspecified upper limb: Secondary | ICD-10-CM | POA: Diagnosis not present

## 2020-05-15 DIAGNOSIS — Z66 Do not resuscitate: Secondary | ICD-10-CM | POA: Diagnosis not present

## 2020-05-15 DIAGNOSIS — I509 Heart failure, unspecified: Secondary | ICD-10-CM | POA: Diagnosis not present

## 2020-05-15 DIAGNOSIS — D638 Anemia in other chronic diseases classified elsewhere: Secondary | ICD-10-CM

## 2020-05-15 DIAGNOSIS — R778 Other specified abnormalities of plasma proteins: Secondary | ICD-10-CM | POA: Diagnosis not present

## 2020-05-15 DIAGNOSIS — R7989 Other specified abnormal findings of blood chemistry: Secondary | ICD-10-CM | POA: Diagnosis present

## 2020-05-15 DIAGNOSIS — I350 Nonrheumatic aortic (valve) stenosis: Secondary | ICD-10-CM | POA: Diagnosis present

## 2020-05-15 DIAGNOSIS — I313 Pericardial effusion (noninflammatory): Secondary | ICD-10-CM | POA: Diagnosis not present

## 2020-05-15 DIAGNOSIS — J309 Allergic rhinitis, unspecified: Secondary | ICD-10-CM | POA: Diagnosis not present

## 2020-05-15 DIAGNOSIS — F32A Depression, unspecified: Secondary | ICD-10-CM | POA: Diagnosis present

## 2020-05-15 DIAGNOSIS — M62838 Other muscle spasm: Secondary | ICD-10-CM | POA: Diagnosis not present

## 2020-05-15 DIAGNOSIS — Z888 Allergy status to other drugs, medicaments and biological substances status: Secondary | ICD-10-CM

## 2020-05-15 DIAGNOSIS — Z96651 Presence of right artificial knee joint: Secondary | ICD-10-CM | POA: Diagnosis present

## 2020-05-15 DIAGNOSIS — M1990 Unspecified osteoarthritis, unspecified site: Secondary | ICD-10-CM | POA: Diagnosis present

## 2020-05-15 DIAGNOSIS — I259 Chronic ischemic heart disease, unspecified: Secondary | ICD-10-CM | POA: Diagnosis not present

## 2020-05-15 DIAGNOSIS — J9811 Atelectasis: Secondary | ICD-10-CM | POA: Diagnosis present

## 2020-05-15 DIAGNOSIS — R300 Dysuria: Secondary | ICD-10-CM

## 2020-05-15 DIAGNOSIS — R0602 Shortness of breath: Secondary | ICD-10-CM | POA: Diagnosis not present

## 2020-05-15 DIAGNOSIS — I5032 Chronic diastolic (congestive) heart failure: Secondary | ICD-10-CM | POA: Diagnosis not present

## 2020-05-15 DIAGNOSIS — R0603 Acute respiratory distress: Secondary | ICD-10-CM | POA: Diagnosis present

## 2020-05-15 DIAGNOSIS — E8770 Fluid overload, unspecified: Secondary | ICD-10-CM | POA: Diagnosis not present

## 2020-05-15 DIAGNOSIS — I493 Ventricular premature depolarization: Secondary | ICD-10-CM | POA: Diagnosis present

## 2020-05-15 LAB — URINALYSIS, COMPLETE (UACMP) WITH MICROSCOPIC
Bacteria, UA: NONE SEEN
Bilirubin Urine: NEGATIVE
Glucose, UA: NEGATIVE mg/dL
Hgb urine dipstick: NEGATIVE
Ketones, ur: NEGATIVE mg/dL
Nitrite: NEGATIVE
Protein, ur: NEGATIVE mg/dL
Specific Gravity, Urine: 1.005 (ref 1.005–1.030)
pH: 7 (ref 5.0–8.0)

## 2020-05-15 LAB — BASIC METABOLIC PANEL
Anion gap: 13 (ref 5–15)
BUN: 14 mg/dL (ref 8–23)
CO2: 21 mmol/L — ABNORMAL LOW (ref 22–32)
Calcium: 9 mg/dL (ref 8.9–10.3)
Chloride: 103 mmol/L (ref 98–111)
Creatinine, Ser: 1.45 mg/dL — ABNORMAL HIGH (ref 0.44–1.00)
GFR, Estimated: 35 mL/min — ABNORMAL LOW (ref >60)
Glucose, Bld: 97 mg/dL (ref 70–99)
Potassium: 3.5 mmol/L (ref 3.5–5.1)
Sodium: 137 mmol/L (ref 135–145)

## 2020-05-15 LAB — RESP PANEL BY RT-PCR (FLU A&B, COVID) ARPGX2
Influenza A by PCR: NEGATIVE
Influenza B by PCR: NEGATIVE
SARS Coronavirus 2 by RT PCR: NEGATIVE

## 2020-05-15 LAB — CBC
HCT: 33.5 % — ABNORMAL LOW (ref 36.0–46.0)
Hemoglobin: 10.8 g/dL — ABNORMAL LOW (ref 12.0–15.0)
MCH: 29.3 pg (ref 26.0–34.0)
MCHC: 32.2 g/dL (ref 30.0–36.0)
MCV: 90.8 fL (ref 80.0–100.0)
Platelets: 248 10*3/uL (ref 150–400)
RBC: 3.69 MIL/uL — ABNORMAL LOW (ref 3.87–5.11)
RDW: 13.5 % (ref 11.5–15.5)
WBC: 5.5 10*3/uL (ref 4.0–10.5)
nRBC: 0 % (ref 0.0–0.2)

## 2020-05-15 LAB — BRAIN NATRIURETIC PEPTIDE: B Natriuretic Peptide: 2183.7 pg/mL — ABNORMAL HIGH (ref 0.0–100.0)

## 2020-05-15 LAB — TROPONIN I (HIGH SENSITIVITY)
Troponin I (High Sensitivity): 113 ng/L (ref <18)
Troponin I (High Sensitivity): 108 ng/L (ref <18)
Troponin I (High Sensitivity): 113 ng/L (ref <18)

## 2020-05-15 LAB — PROCALCITONIN: Procalcitonin: <0.1 ng/mL

## 2020-05-15 MED ORDER — SODIUM CHLORIDE 0.9% FLUSH
3.0000 mL | Freq: Two times a day (BID) | INTRAVENOUS | Status: DC
  Administered 2020-05-15 – 2020-05-19 (×8): 3 mL via INTRAVENOUS

## 2020-05-15 MED ORDER — FUROSEMIDE 10 MG/ML IJ SOLN
40.0000 mg | Freq: Once | INTRAMUSCULAR | Status: AC
  Administered 2020-05-15: 40 mg via INTRAVENOUS
  Filled 2020-05-15: qty 4

## 2020-05-15 MED ORDER — ENOXAPARIN SODIUM 40 MG/0.4ML ~~LOC~~ SOLN
40.0000 mg | SUBCUTANEOUS | Status: DC
  Administered 2020-05-15: 40 mg via SUBCUTANEOUS
  Filled 2020-05-15: qty 0.4

## 2020-05-15 MED ORDER — ACETAMINOPHEN 500 MG PO TABS: 1000.0000 mg | ORAL_TABLET | Freq: Four times a day (QID) | ORAL | Status: DC | PRN

## 2020-05-15 MED ORDER — ACETAMINOPHEN 325 MG PO TABS: 650.0000 mg | ORAL_TABLET | ORAL | Status: DC | PRN

## 2020-05-15 MED ORDER — SODIUM CHLORIDE 0.9 % IV SOLN: 250.0000 mL | INTRAVENOUS | Status: DC | PRN

## 2020-05-15 MED ORDER — LOSARTAN POTASSIUM 50 MG PO TABS
100.0000 mg | ORAL_TABLET | Freq: Every day | ORAL | Status: DC
  Administered 2020-05-16 – 2020-05-19 (×4): 100 mg via ORAL
  Filled 2020-05-15 (×4): qty 2

## 2020-05-15 MED ORDER — FUROSEMIDE 10 MG/ML IJ SOLN
40.0000 mg | Freq: Every day | INTRAMUSCULAR | Status: DC
  Administered 2020-05-16 – 2020-05-17 (×2): 40 mg via INTRAVENOUS
  Filled 2020-05-15 (×2): qty 4

## 2020-05-15 MED ORDER — CYCLOBENZAPRINE HCL 10 MG PO TABS: 10.0000 mg | ORAL_TABLET | Freq: Three times a day (TID) | ORAL | Status: DC | PRN

## 2020-05-15 MED ORDER — ENOXAPARIN SODIUM 30 MG/0.3ML ~~LOC~~ SOLN
30.0000 mg | SUBCUTANEOUS | Status: DC
  Administered 2020-05-16 – 2020-05-18 (×3): 30 mg via SUBCUTANEOUS
  Filled 2020-05-15 (×3): qty 0.3

## 2020-05-15 MED ORDER — SODIUM CHLORIDE 0.9% FLUSH: 3.0000 mL | INTRAVENOUS | Status: DC | PRN

## 2020-05-15 MED ORDER — FUROSEMIDE 10 MG/ML IJ SOLN: 40.0000 mg | Freq: Every day | INTRAMUSCULAR | Status: DC

## 2020-05-15 MED ORDER — ONDANSETRON HCL 4 MG/2ML IJ SOLN: 4.0000 mg | Freq: Four times a day (QID) | INTRAMUSCULAR | Status: DC | PRN

## 2020-05-15 MED ORDER — CLOBETASOL PROPIONATE 0.05 % EX OINT
TOPICAL_OINTMENT | Freq: Two times a day (BID) | CUTANEOUS | Status: DC
  Filled 2020-05-15 (×2): qty 15

## 2020-05-15 MED ORDER — ASPIRIN 81 MG PO CHEW
324.0000 mg | CHEWABLE_TABLET | Freq: Once | ORAL | Status: AC
  Administered 2020-05-15: 324 mg via ORAL

## 2020-05-15 NOTE — ED Notes (Signed)
Patient talking on phone to granddaughter.

## 2020-05-15 NOTE — ED Provider Notes (Signed)
[facility] Emergency Department Provider Note   ____________________________________________   Event Date/Time   First MD Initiated Contact with Patient 05/15/20 1507     (approximate)  I have reviewed the triage vital signs and the nursing notes.   HISTORY  Chief Complaint Shortness of Breath and Irregular Heart Beat    HPI A 87 year old patient with a history of hypertension presents for evaluation of chest pain. Initial onset of pain was more than 6 hours ago. The patient's chest pain is worse with exertion. The patient reports some diaphoresis. The patient's chest pain is not middle- or left-sided, is not well-localized, is not described as heaviness/pressure/tightness, is not sharp and does not radiate to the arms/jaw/neck. The patient does not complain of nausea. The patient has smoked in the past 90 days. The patient has no history of stroke, has no history of peripheral artery disease, denies any history of treated diabetes, has no relevant family history of coronary artery disease (first degree relative at less than age 40), has no history of hypercholesterolemia and does not have an elevated BMI (>=30).   No chest pain.  Patient reports been having shortness of breath though.  Seem to be worsening since Thursday also noticed some swelling in both ankles.  Had a slight cough and she just feels like a sensation like her heart is under stress cannot describe it too well  Also the patient has been treated for urinary tract infection on 3 different antibiotics now, just started 2 days ago on what she reports to believes to be Bactrim.  She was treated with Cipro previously as well as cephalexin and continues to report dysuria and frequent urination as well.  None  Past Medical History:  Diagnosis Date  . Allergic eczema   . Anemia   . Carpal tunnel syndrome   . Chest pain, atypical   . Depression   . Depressive disorder   . Diverticulosis   . DJD  (degenerative joint disease)   . Eczema   . Essential hypertension   . H/O: hysterectomy   . Hives   . IBD (inflammatory bowel disease)   . Irritable bowel syndrome   . PONV (postoperative nausea and vomiting)    left knee arthroscopy  . Rhinitis, allergic   . Right carotid bruit     Patient Active Problem List   Diagnosis Date Noted  . Acute CHF (congestive heart failure) ([place]) 05/15/2020  . Primary osteoarthritis of right knee 08/21/2016    Past Surgical History:  Procedure Laterality Date  . ABDOMINAL HYSTERECTOMY    . BACK SURGERY  2008   [facility]  . CARPAL TUNNEL RELEASE Left 1990's  . COLONOSCOPY WITH PROPOFOL N/A 07/22/2014   Procedure: COLONOSCOPY WITH PROPOFOL;  Surgeon: Hulen Luster, MD;  Location: [facility] ENDOSCOPY;  Service: Gastroenterology;  Laterality: N/A;  . JOINT REPLACEMENT    . OOPHORECTOMY Right 2003  . RECTAL SURGERY  2003  . TOTAL KNEE ARTHROPLASTY Right 08/21/2016   Procedure: TOTAL KNEE ARTHROPLASTY;  Surgeon: Hessie Knows, MD;  Location: [facility] ORS;  Service: Orthopedics;  Laterality: Right;    Prior to Admission medications   Medication Sig Start Date End Date Taking? Authorizing Provider  hydrochlorothiazide (HYDRODIURIL) 25 MG tablet Take 25 mg by mouth daily. In am.   Yes [provider]  losartan (COZAAR) 100 MG tablet Take 100 mg by mouth daily. In am.   Yes [provider]  acetaminophen (TYLENOL) 500 MG tablet Take 1,000 mg  by mouth every 6 (six) hours as needed for mild pain.    [provider]  citalopram (CELEXA) 20 MG tablet Take 30 mg by mouth daily. In am. Patient not taking: Reported on 05/15/2020 05/21/16   [provider]  cyclobenzaprine (FLEXERIL) 10 MG tablet Take 10 mg by mouth 3 (three) times daily as needed for muscle spasms.    [provider]  enoxaparin (LOVENOX) 40 MG/0.4ML injection Inject 0.4 mLs (40 mg total) into the skin daily. 08/23/16 09/06/16  Duanne Guess, PA-C  oxyCODONE (OXY  IR/ROXICODONE) 5 MG immediate release tablet Take 1-2 tablets (5-10 mg total) by mouth every 3 (three) hours as needed for breakthrough pain. Patient not taking: Reported on 05/15/2020 08/23/16   Duanne Guess, PA-C  traMADol (ULTRAM) 50 MG tablet Take 1 tablet (50 mg total) by mouth every 6 (six) hours as needed. Patient not taking: Reported on 05/15/2020 08/07/17   Carrie Mew, MD    Allergies Ace inhibitors and Ciprofloxacin  Family History  Problem Relation Age of Onset  . Breast cancer Paternal Grandmother     Social History Social History   Tobacco Use  . Smoking status: Current Every Day Smoker    Packs/day: 1.00  . Smokeless tobacco: Never Used  Substance Use Topics  . Alcohol use: No  . Drug use: No    Review of Systems Constitutional: No fever/chills Eyes: No visual changes. ENT: No sore throat. Cardiovascular: Denies chest pain. Respiratory: Mild shortness of breath worse at night when she tries to lay down.  Worse with exertion. Gastrointestinal: No abdominal pain.   Genitourinary: Negative for dysuria. Musculoskeletal: Negative for back pain.  Some slight ankle swelling last 2 days. Skin: Negative for rash. Neurological: Negative for headaches, areas of focal weakness or numbness.    ____________________________________________   PHYSICAL EXAM:  VITAL SIGNS: ED Triage Vitals  Enc Vitals Group     BP 05/15/20 1419 (!) 170/93     Pulse Rate 05/15/20 1419 69     Resp 05/15/20 1419 20     Temp 05/15/20 1419 98.4 F (36.9 C)     Temp Source 05/15/20 1419 Oral     SpO2 05/15/20 1419 95 %     Weight 05/15/20 1415 172 lb (78 kg)     Height 05/15/20 1415 5\' 6"  (1.676 m)     Head Circumference --      Peak Flow --      Pain Score 05/15/20 1415 0     Pain Loc --      Pain Edu? --      Excl. in [place]? --     Constitutional: Alert and oriented. Well appearing and in no acute distress. Eyes: Conjunctivae are normal. Head: Atraumatic. Nose: No  congestion/rhinnorhea. Mouth/Throat: Mucous membranes are moist. Neck: No stridor.  Cardiovascular: Normal rate, irregular rhythm. Grossly normal heart sounds.  Good peripheral circulation. Respiratory: Normal respiratory effort.  No retractions. Lungs CTAB. Gastrointestinal: Soft and nontender. No distention. Musculoskeletal: No lower extremity tenderness nor edema for some trace edema around the ankles bilateral. Neurologic:  Normal speech and language. No gross focal neurologic deficits are appreciated.  Skin:  Skin is warm, dry and intact. No rash noted. Psychiatric: Mood and affect are normal. Speech and behavior are normal.  ____________________________________________   LABS (all labs ordered are listed, but only abnormal results are displayed)  Labs Reviewed  BASIC METABOLIC PANEL - Abnormal; Notable for the following components:  Result Value   CO2 21 (*)    Creatinine, Ser 1.45 (*)    GFR, Estimated 35 (*)    All other components within normal limits  CBC - Abnormal; Notable for the following components:   RBC 3.69 (*)    Hemoglobin 10.8 (*)    HCT 33.5 (*)    All other components within normal limits  BRAIN NATRIURETIC PEPTIDE - Abnormal; Notable for the following components:   B Natriuretic Peptide 2,183.7 (*)    All other components within normal limits  TROPONIN I (HIGH SENSITIVITY) - Abnormal; Notable for the following components:   Troponin I (High Sensitivity) 113 (*)    All other components within normal limits  RESP PANEL BY RT-PCR (FLU A&B, COVID) ARPGX2  URINE CULTURE  PROCALCITONIN  URINALYSIS, COMPLETE (UACMP) WITH MICROSCOPIC  TROPONIN I (HIGH SENSITIVITY)   ____________________________________________  EKG  Reviewed inter by me at 1416 Heart rate 80 QRS 80 QTc 500 Sinus rhythm with occasional and frequent PACs. Minimal nonspecific T wave abnormality.  No evidence of what appears to be apparent ischemic change however.  Q-wave noted in lead  III ____________________________________________  RADIOLOGY  DG Chest 2 View  Result Date: 05/15/2020 CLINICAL DATA:  Shortness of breath and cough 5 days. EXAM: CHEST - 2 VIEW COMPARISON:  08/23/2006 FINDINGS: Patient is slightly rotated to the left. Lungs are adequately inflated with minimal bibasilar opacification likely small amount of bilateral pleural fluid with associated atelectasis. Infection in the lung bases is possible. Cardiomediastinal silhouette and remainder of the exam is unchanged. IMPRESSION: Mild bibasilar opacification likely small effusions with associated atelectasis. Infection in the lung bases is possible. Electronically Signed   By: Marin Olp M.D.   On: 05/15/2020 15:23    Bilateral small pleural effusions, personally imaging reviewed and interpreted by me.  Also cannot rule out some compression or small consolidation. ____________________________________________   PROCEDURES  Procedure(s) performed: None  Procedures  Critical Care performed: No  ____________________________________________   INITIAL IMPRESSION / ASSESSMENT AND PLAN / ED COURSE  Pertinent labs & imaging results that were available during my care of the patient were reviewed by me and considered in my medical decision making (see chart for details).   Patient presents for dyspnea also associated with dysuria and treatment with 3 antibiotics now for suspected urinary tract infection.  On exam she has some stigmata of possible CHF she is somewhat hypertensive she has trace bilateral ankle edema, she has bilateral small pleural effusions and reports dyspnea with exertion.  She does deny chest pain.  Her troponin however is slightly elevated consistent with what appears to be apparent demand type ischemia.  The etiology of this is not entirely clear, she does not carry a history of congestive heart failure or coronary disease.  Patient does not any pleuritic chest pain.  Her chest x-ray and her  clinical history without fevers normal white count seem to suggest against pneumonia.  We will check another urinalysis and send urine culture, she is currently on her third antibiotic for dysuria and frequency this will also need further evaluation.  Discussed with the patient, will admit her for further work-up.  Decision for next treatment step pending given procalcitonin and BNP levels though I suspect likely CHF and unlikely infectious etiology.  Covid test pending  Patient understand agreeable with plan for admission  Clinical Course as of 05/15/20 1639  Sun May 15, 2020  1550 Admission discussed with Dr. Francine Graven [MQ]    Clinical  Course User Index [MQ] Delman Kitten, MD    ----------------------------------------- 4:35 PM on 05/15/2020 -----------------------------------------  Low procalcitonin argues against pneumonia.  Patient's BNP is markedly elevated, with this and her clinical examination will provide Lasix.  Patient will obviously need to be admitted for further work-up for concerns of what appears to be some type of volume overload cardiac ischemia, heart strain, or potentially new onset congestive heart failure.  Also needs further work-up regarding dysuria ____________________________________________   FINAL CLINICAL IMPRESSION(S) / ED DIAGNOSES  Final diagnoses:  Hypervolemia, unspecified hypervolemia type  Dysuria  Demand ischemia of myocardium [facility])        Note:  This document was prepared using Dragon voice recognition software and may include unintentional dictation errors       Delman Kitten, MD 05/15/20 1639

## 2020-05-15 NOTE — H&P (Signed)
History and Physical    Paula Powell HER:[id number] DOB: 01-28-1933 DOA: 05/15/2020  PCP: Derinda Late, MD   Patient coming from: Home  I have personally briefly reviewed patient's old medical records in [place]  Chief Complaint: Shortness of breath x 4 days  HPI: [name] is a 87 y.o. female with medical history significant for hypertension, depression, osteoarthritis who presents to the ER for evaluation of shortness of breath for about 4 days.  Patient noticed shortness of breath with exertion that has worsened to shortness of breath at rest.  She has some mild lower extremity swelling and has also noted that she has to sit up in bed or use multiple pillows to keep her head elevated while she sleeps. She was recently treated for UTI with antibiotic therapy and was on cephalexin, ciprofloxacin which was switched to Bactrim due to poor tolerance. She continues to have frequency of urination and suprapubic pain but denies having any dysuria, she has chest pain mostly when she coughs but denies having any nausea, no vomiting, no palpitations, no diaphoresis, no headache, no dizziness, no lightheadedness, no blurred vision or any focal deficits. She has a history of nicotine dependence but quit about 3 days prior to her admission because she has not felt well. Labs show sodium 137, potassium 3.5, chloride 103, bicarb 21, glucose 97, BUN 14, creatinine 1.45, calcium 9.0, BNP 2183, troponin I 13, procalcitonin less than 0.10, white count 5.5, hemoglobin 10.8, hematocrit 33.5, MCV 90.8, RDW 13.5, platelet count 248 Respiratory viral panel is negative Chest x-ray reviewed by me shows mild bibasilar opacification likely small effusions with associated atelectasis. Infection in the lung bases is possible. Twelve-lead EKG reviewed by me shows sinus rhythm with PVCs.  ED Course: Patient is an 87 year old Caucasian female who presents to the emergency room for evaluation of shortness of  breath which was initially with exertion but now she is short of breath at rest associated with cough and pleuritic chest pain as well as orthopnea and bilateral lower extremity swelling.  BNP is elevated and chest x-ray shows bilateral pleural effusions.  She received a dose of Lasix in the ER and will be admitted to the hospital for further evaluation.    Review of Systems: As per HPI otherwise all other systems reviewed and negative.    Past Medical History:  Diagnosis Date  . Allergic eczema   . Anemia   . Carpal tunnel syndrome   . Chest pain, atypical   . Depression   . Depressive disorder   . Diverticulosis   . DJD (degenerative joint disease)   . Eczema   . Essential hypertension   . H/O: hysterectomy   . Hives   . IBD (inflammatory bowel disease)   . Irritable bowel syndrome   . PONV (postoperative nausea and vomiting)    left knee arthroscopy  . Rhinitis, allergic   . Right carotid bruit     Past Surgical History:  Procedure Laterality Date  . ABDOMINAL HYSTERECTOMY    . BACK SURGERY  2008   [facility]  . CARPAL TUNNEL RELEASE Left 1990's  . COLONOSCOPY WITH PROPOFOL N/A 07/22/2014   Procedure: COLONOSCOPY WITH PROPOFOL;  Surgeon: Hulen Luster, MD;  Location: [facility] ENDOSCOPY;  Service: Gastroenterology;  Laterality: N/A;  . JOINT REPLACEMENT    . OOPHORECTOMY Right 2003  . RECTAL SURGERY  2003  . TOTAL KNEE ARTHROPLASTY Right 08/21/2016   Procedure: TOTAL KNEE ARTHROPLASTY;  Surgeon: Hessie Knows, MD;  Location: [facility] ORS;  Service: Orthopedics;  Laterality: Right;     reports that she has been smoking. She has been smoking about 1.00 pack per day. She has never used smokeless tobacco. She reports that she does not drink alcohol and does not use drugs.  Allergies  Allergen Reactions  . Ace Inhibitors Cough  . Ciprofloxacin Nausea Only    Family History  Problem Relation Age of Onset  . Breast cancer Paternal Grandmother       Prior to Admission medications    Medication Sig Start Date End Date Taking? Authorizing Provider  clobetasol ointment (TEMOVATE) 0.05 % Apply topically 2 (two) times daily. 07/30/19 07/29/20 Yes [provider]  hydrochlorothiazide (HYDRODIURIL) 25 MG tablet Take 25 mg by mouth daily. In am.   Yes [provider]  losartan (COZAAR) 100 MG tablet Take 100 mg by mouth daily. In am.   Yes [provider]  sulfamethoxazole-trimethoprim (BACTRIM DS) 800-160 MG tablet Take 1 tablet by mouth 2 (two) times daily. 05/13/20  Yes [provider]  acetaminophen (TYLENOL) 500 MG tablet Take 1,000 mg by mouth every 6 (six) hours as needed for mild pain.    [provider]  citalopram (CELEXA) 20 MG tablet Take 30 mg by mouth daily. In am. Patient not taking: No sig reported 05/21/16   [provider]  cyclobenzaprine (FLEXERIL) 10 MG tablet Take 10 mg by mouth 3 (three) times daily as needed for muscle spasms.    [provider]  enoxaparin (LOVENOX) 40 MG/0.4ML injection Inject 0.4 mLs (40 mg total) into the skin daily. 08/23/16 09/06/16  Duanne Guess, PA-C  oxyCODONE (OXY IR/ROXICODONE) 5 MG immediate release tablet Take 1-2 tablets (5-10 mg total) by mouth every 3 (three) hours as needed for breakthrough pain. Patient not taking: No sig reported 08/23/16   Duanne Guess, PA-C  traMADol (ULTRAM) 50 MG tablet Take 1 tablet (50 mg total) by mouth every 6 (six) hours as needed. Patient not taking: No sig reported 08/07/17   Carrie Mew, MD    Physical Exam: Vitals:   05/15/20 1415 05/15/20 1419 05/15/20 1532  BP:  (!) 170/93 (!) 155/94  Pulse:  69 75  Resp:  20 (!) 23  Temp:  98.4 F (36.9 C)   TempSrc:  Oral   SpO2:  95% 97%  Weight: 78 kg    Height: 5\' 6"  (1.676 m)       Vitals:   05/15/20 1415 05/15/20 1419 05/15/20 1532  BP:  (!) 170/93 (!) 155/94  Pulse:  69 75  Resp:  20 (!) 23  Temp:  98.4 F (36.9 C)   TempSrc:  Oral   SpO2:  95% 97%  Weight: 78 kg     Height: 5\' 6"  (1.676 m)        Constitutional: Alert and oriented x 3 . Not in any apparent distress HEENT:      Head: Normocephalic and atraumatic.         Eyes: PERLA, EOMI, Conjunctivae pallor. Sclera is non-icteric.       Mouth/Throat: Mucous membranes are moist.       Neck: Supple with no signs of meningismus. Cardiovascular: Regular rate and rhythm. No murmurs, gallops, or rubs. 2+ symmetrical distal pulses are present . No JVD. Trace LE edema Respiratory: Respiratory effort normal .  Crackles at the lung bases bilaterally. No wheezes, crackles, or rhonchi.  Gastrointestinal: Soft, suprapubic tenderness, and non distended with positive bowel sounds.  Genitourinary: No CVA tenderness. Musculoskeletal: Nontender with normal range of motion in all extremities. No cyanosis, or erythema of extremities. Neurologic:  Face is symmetric. Moving all extremities. No gross focal neurologic deficits  Skin: Skin is warm, dry.  No rash or ulcers Psychiatric: Mood and affect are normal    Labs on Admission: I have personally reviewed following labs and imaging studies  CBC: Recent Labs  Lab 05/15/20 1418  WBC 5.5  HGB 10.8*  HCT 33.5*  MCV 90.8  PLT 885   Basic Metabolic Panel: Recent Labs  Lab 05/15/20 1418  NA 137  K 3.5  CL 103  CO2 21*  GLUCOSE 97  BUN 14  CREATININE 1.45*  CALCIUM 9.0   GFR: Estimated Creatinine Clearance: 29.9 mL/min (A) (by C-G formula based on SCr of 1.45 mg/dL (H)). Liver Function Tests: No results for input(s): AST, ALT, ALKPHOS, BILITOT, PROT, ALBUMIN in the last 168 hours. No results for input(s): LIPASE, AMYLASE in the last 168 hours. No results for input(s): AMMONIA in the last 168 hours. Coagulation Profile: No results for input(s): INR, PROTIME in the last 168 hours. Cardiac Enzymes: No results for input(s): CKTOTAL, CKMB, CKMBINDEX, TROPONINI in the last 168 hours. BNP (last 3 results) No results for input(s): PROBNP in the last 8760  hours. HbA1C: No results for input(s): HGBA1C in the last 72 hours. CBG: No results for input(s): GLUCAP in the last 168 hours. Lipid Profile: No results for input(s): CHOL, HDL, LDLCALC, TRIG, CHOLHDL, LDLDIRECT in the last 72 hours. Thyroid Function Tests: No results for input(s): TSH, T4TOTAL, FREET4, T3FREE, THYROIDAB in the last 72 hours. Anemia Panel: No results for input(s): VITAMINB12, FOLATE, FERRITIN, TIBC, IRON, RETICCTPCT in the last 72 hours. Urine analysis:    Component Value Date/Time   COLORURINE AMBER (A) 08/08/2016 1443   APPEARANCEUR CLEAR (A) 08/08/2016 1443   LABSPEC 1.023 08/08/2016 1443   PHURINE 5.0 08/08/2016 1443   GLUCOSEU NEGATIVE 08/08/2016 1443   HGBUR NEGATIVE 08/08/2016 1443   BILIRUBINUR NEGATIVE 08/08/2016 1443   KETONESUR 5 (A) 08/08/2016 1443   PROTEINUR NEGATIVE 08/08/2016 1443   UROBILINOGEN 0.2 08/23/2006 1430   NITRITE NEGATIVE 08/08/2016 1443   LEUKOCYTESUR NEGATIVE 08/08/2016 1443    Radiological Exams on Admission: DG Chest 2 View  Result Date: 05/15/2020 CLINICAL DATA:  Shortness of breath and cough 5 days. EXAM: CHEST - 2 VIEW COMPARISON:  08/23/2006 FINDINGS: Patient is slightly rotated to the left. Lungs are adequately inflated with minimal bibasilar opacification likely small amount of bilateral pleural fluid with associated atelectasis. Infection in the lung bases is possible. Cardiomediastinal silhouette and remainder of the exam is unchanged. IMPRESSION: Mild bibasilar opacification likely small effusions with associated atelectasis. Infection in the lung bases is possible. Electronically Signed   By: Marin Olp M.D.   On: 05/15/2020 15:23     Assessment/Plan Principal Problem:   Acute CHF (congestive heart failure) (HCC) Active Problems:   Nicotine dependence   Depressive disorder   Essential hypertension   Anemia of chronic disease   Elevated troponin     Acute CHF Unclear etiology Patient presents for evaluation  of exertional shortness of breath, orthopnea and bilateral lower extremity swelling. We will start patient on Lasix 40 mg IV daily Continue losartan Not on beta-blockers due to acute exacerbation Obtain 2D echocardiogram to assess LVEF and rule out regional wall motion abnormality    Hypertension Continue losartan   Elevated troponin Most likely secondary to demand ischemia from worsening shortness  of breath We will cycle cardiac enzymes Continue aspirin   Anemia of chronic disease H&H is stable   Nicotine dependence Smoking cessation has been discussed with patient She declines a nicotine patch at this time and will request if she needs it   DVT prophylaxis: Lovenox  Code Status: full code Family Communication: Greater than 50% of the time was spent discussing patient's condition and plan of care with her at the bedside.  All questions and concerns have been addressed.  She verbalizes understanding and agrees with the plan.  CODE STATUS was discussed with patient and she is a full code Disposition Plan: Back to previous home environment Consults called: none Status: At the time of admission, it appears that the appropriate admission status for this patient is inpatient.   This is judged to be reasonable and necessary in order to provide the required intensity of service to ensure the patient's safety given the presenting symptoms, physical exam findings, and initial radiographic and laboratory data in the context of their comorbid conditions. Patient requires inpatient status due to high intensity of service, high risk for further deterioration and high frequency of surveillance required.    Collier Bullock MD [facility]     05/15/2020, 5:06 PM

## 2020-05-15 NOTE — ED Notes (Signed)
Paula Powell [id number]

## 2020-05-15 NOTE — ED Notes (Signed)
Dr. Creig Hines notified of trop of [phone]

## 2020-05-15 NOTE — ED Triage Notes (Signed)
Pt via POV from home. Pt c/o SOB and productive cough since Wednesday. Denies CP. Pt was seen at her PCP on Friday where per family member they thought she may have been in atrial fibrillation. Pt is A&Ox4 and NAD.

## 2020-05-15 NOTE — ED Notes (Signed)
Pt brought to room and states has to pee. Helped to toliet. Pt made aware to hit bell when done. John, RN aware that pt is on toliet.

## 2020-05-15 NOTE — ED Notes (Signed)
Called dietary for meal tray

## 2020-05-15 NOTE — ED Notes (Signed)
Patient eating meal tray at this time. 

## 2020-05-16 DIAGNOSIS — N1832 Chronic kidney disease, stage 3b: Secondary | ICD-10-CM

## 2020-05-16 DIAGNOSIS — I10 Essential (primary) hypertension: Secondary | ICD-10-CM | POA: Diagnosis not present

## 2020-05-16 DIAGNOSIS — I509 Heart failure, unspecified: Secondary | ICD-10-CM | POA: Diagnosis not present

## 2020-05-16 LAB — CBC
HCT: 34.1 % — ABNORMAL LOW (ref 36.0–46.0)
Hemoglobin: 11.1 g/dL — ABNORMAL LOW (ref 12.0–15.0)
MCH: 29.6 pg (ref 26.0–34.0)
MCHC: 32.6 g/dL (ref 30.0–36.0)
MCV: 90.9 fL (ref 80.0–100.0)
Platelets: 238 10*3/uL (ref 150–400)
RBC: 3.75 MIL/uL — ABNORMAL LOW (ref 3.87–5.11)
RDW: 13.4 % (ref 11.5–15.5)
WBC: 6.4 10*3/uL (ref 4.0–10.5)
nRBC: 0 % (ref 0.0–0.2)

## 2020-05-16 LAB — BASIC METABOLIC PANEL
Anion gap: 10 (ref 5–15)
BUN: 17 mg/dL (ref 8–23)
CO2: 23 mmol/L (ref 22–32)
Calcium: 8.9 mg/dL (ref 8.9–10.3)
Chloride: 103 mmol/L (ref 98–111)
Creatinine, Ser: 1.65 mg/dL — ABNORMAL HIGH (ref 0.44–1.00)
GFR, Estimated: 30 mL/min — ABNORMAL LOW (ref >60)
Glucose, Bld: 89 mg/dL (ref 70–99)
Potassium: 3.5 mmol/L (ref 3.5–5.1)
Sodium: 136 mmol/L (ref 135–145)

## 2020-05-16 MED ORDER — ORAL CARE MOUTH RINSE
15.0000 mL | Freq: Two times a day (BID) | OROMUCOSAL | Status: DC
  Administered 2020-05-16 – 2020-05-19 (×6): 15 mL via OROMUCOSAL

## 2020-05-16 NOTE — ED Notes (Signed)
purewick leaking, pt changed into gown and linens changed. Pt ambulatory with minimal assistance to chair in room while linens being changed. Pt declines offer of food/drink or any needs at this time. Warm blankets provided and updated on poc.

## 2020-05-16 NOTE — Evaluation (Signed)
Physical Therapy Evaluation Patient Details Name: Paula Powell MRN: [id number] DOB: [birth year] Today's Date: 05/16/2020   History of Present Illness  87 y.o. female with medical history significant for hypertension, depression, osteoarthritis who presents to the ER for evaluation of shortness of breath, orthopnea and bilateral lower extremity swelling.  Clinical Impression  Patient received sitting up on side of bed, talking on phone. O2 and telemetry off. Patient O2 sats at rest in mid 90%s. Patient requires min guard for sit to stand. Min guard for ambulation with RW 150 feet. Patient is steady with rolling walker, but requires cues for safety and awareness. Patient let go of walker in last 6 feet of walking and was reaching for bed to steady self. She will continue to benefit from skilled PT while here to improve strength and safety with mobility. Patient will benefit from 24 hour assist to prevent falls. May need short term rehab stay if family not available.        Follow Up Recommendations SNF;Supervision/Assistance - 24 hour    Equipment Recommendations  None recommended by PT    Recommendations for Other Services       Precautions / Restrictions Precautions Precautions: Fall Restrictions Weight Bearing Restrictions: No      Mobility  Bed Mobility Overal bed mobility: Modified Independent             General bed mobility comments: not assessed, patient sitting up on side of bed when arrived, returned to recliner for lunch at end of session    Transfers Overall transfer level: Needs assistance Equipment used: Rolling walker (2 wheeled) Transfers: Sit to/from Stand Sit to Stand: Supervision Stand pivot transfers: Min assist       General transfer comment: MIN A for stand pivot transfer w/out RW d/t pt unsteadiness with presence of lines  Ambulation/Gait Ambulation/Gait assistance: Min guard Gait Distance (Feet): 150 Feet Assistive device: Rolling walker (2  wheeled) Gait Pattern/deviations: Step-through pattern;Trunk flexed Gait velocity: slightly decreased   General Gait Details: Patient is generally steady with RW, but benefits from supervision and cues. SOB with ambulation. Unable to get O2 saturation reading.  Stairs            Wheelchair Mobility    Modified Rankin (Stroke Patients Only)       Balance Overall balance assessment: Needs assistance Sitting-balance support: Feet supported Sitting balance-Leahy Scale: Normal Sitting balance - Comments: Sitting EOB Postural control: Posterior lean Standing balance support: Bilateral upper extremity supported;During functional activity Standing balance-Leahy Scale: Fair Standing balance comment: min guard and use of rolling walker for ambulation. Unsteady when not using walker                             Pertinent Vitals/Pain Pain Assessment: No/denies pain Faces Pain Scale: Hurts a little bit Pain Location: Stomach Pain Descriptors / Indicators: Aching Pain Intervention(s): Limited activity within patient's tolerance;Monitored during session    [place] expects to be discharged to:: Private residence Living Arrangements: Alone Available Help at Discharge: Family;Available PRN/intermittently Type of Home: House Home Access: Stairs to enter Entrance Stairs-Rails: Left Entrance Stairs-Number of Steps: 1 Home Layout: One level Home Equipment: Walker - 2 wheels;Cane - single point;Grab bars - toilet;Grab bars - tub/shower;Walker - standard      Prior Function Level of Independence: Independent   Gait / Transfers Assistance Needed: Pt ambulates limited distances without assistive device. Pt endorses fear of falling  ADL's /  Homemaking Assistance Needed: Independent with ADLs and able to perform light housekeeping and prepare simple meals. Receives assist for other IADLs from children.        Hand Dominance   Dominant Hand: Right     Extremity/Trunk Assessment   Upper Extremity Assessment Upper Extremity Assessment: Defer to OT evaluation    Lower Extremity Assessment Lower Extremity Assessment: Generalized weakness    Cervical / Trunk Assessment Cervical / Trunk Assessment: Normal  Communication   Communication: No difficulties  Cognition Arousal/Alertness: Awake/alert Behavior During Therapy: WFL for tasks assessed/performed Overall Cognitive Status: Within Functional Limits for tasks assessed                                 General Comments: decreased safety awareness.      General Comments      Exercises     Assessment/Plan    PT Assessment Patient needs continued PT services  PT Problem List Decreased strength;Decreased balance;Decreased activity tolerance;Decreased mobility;Decreased knowledge of use of DME;Decreased safety awareness       PT Treatment Interventions DME instruction;Therapeutic exercise;Gait training;Balance training;Stair training;Functional mobility training;Therapeutic activities;Patient/family education    PT Goals (Current goals can be found in the Care Plan section)  Acute Rehab PT Goals Patient Stated Goal: to get better PT Goal Formulation: With patient Time For Goal Achievement: 05/30/20 Potential to Achieve Goals: Good    Frequency Min 2X/week   Barriers to discharge Decreased caregiver support      Co-evaluation               AM-PAC PT "6 Clicks" Mobility  Outcome Measure Help needed turning from your back to your side while in a flat bed without using bedrails?: A Little Help needed moving from lying on your back to sitting on the side of a flat bed without using bedrails?: A Little Help needed moving to and from a bed to a chair (including a wheelchair)?: A Little Help needed standing up from a chair using your arms (e.g., wheelchair or bedside chair)?: A Little Help needed to walk in hospital room?: A Little Help needed climbing  3-5 steps with a railing? : A Lot 6 Click Score: 17    End of Session Equipment Utilized During Treatment: Gait belt Activity Tolerance: Patient tolerated treatment well;Other (comment) (SOB) Patient left: in chair;with chair alarm set;with call bell/phone within reach Nurse Communication: Mobility status PT Visit Diagnosis: Unsteadiness on feet (R26.81);Difficulty in walking, not elsewhere classified (R26.2)    Time: 1200-1230 PT Time Calculation (min) (ACUTE ONLY): 30 min   Charges:   PT Evaluation $PT Eval Moderate Complexity: 1 Mod PT Treatments $Gait Training: 8-22 mins        Alin Chavira, PT, GCS 05/16/20,1:02 PM

## 2020-05-16 NOTE — Evaluation (Addendum)
Occupational Therapy Evaluation Patient Details Name: Paula Powell MRN: [id number] DOB: April 09, 1932 Today's Date: 05/16/2020    History of Present Illness 87 y.o. female with medical history significant for hypertension, depression, osteoarthritis who presents to the ER for evaluation of shortness of breath, orthopnea and bilateral lower extremity swelling.   Clinical Impression   Pt seen for OT evaluation this date. Upon arrival to room, pt seated upright in bed, talking on phone with [place] doffed. [place] donned and pt agreeable to OT eval/tx. Prior to admission, pt was independent with ADLs, performing functional mobility w/out AD/DME, living in a one-story home alone, and receiving intermittent assistance from family nearby for IADLs. Pt currently presents with decreased strength, balance, and activity tolerance, and requires SUPERVISION/SET-UP for seated UB dressing, MIN A for sit>stand LB dressing, and MIN A for functional mobility w/out AD (progressed to MIN GUARD for functional mobility with RW). Of note, SpO2 >92% and HR 70-90 throughout session. Unless pt is able to obtain 24/7 assistance/supervision from family, OT to recommend SNF upon discharge to improve functional mobility, standing balance and tolerance, and strength to a level in which she can return to safely living alone.     Follow Up Recommendations  SNF    Equipment Recommendations  3 in 1 bedside commode       Precautions / Restrictions Precautions Precautions: Fall Restrictions Weight Bearing Restrictions: No      Mobility Bed Mobility Overal bed mobility: Modified Independent                  Transfers Overall transfer level: Needs assistance   Transfers: Sit to/from Stand;Stand Pivot Transfers Sit to Stand: Supervision Stand pivot transfers: Min assist       General transfer comment: MIN A for stand pivot transfer w/out RW d/t pt unsteadiness with presence of lines    Balance Overall balance  assessment: Needs assistance Sitting-balance support: No upper extremity supported;Feet supported Sitting balance-Leahy Scale: Fair Sitting balance - Comments: Sitting EOB Postural control: Posterior lean   Standing balance-Leahy Scale: Fair Standing balance comment: MIN guard walking ~20feet with RW                           ADL either performed or assessed with clinical judgement   ADL Overall ADL's : Needs assistance/impaired                 Upper Body Dressing : Supervision/safety;Set up;Sitting Upper Body Dressing Details (indicate cue type and reason): to don/doff hospital gown Lower Body Dressing: Minimal assistance;Sitting/lateral leans;Sit to/from stand Lower Body Dressing Details (indicate cue type and reason): To don/doff socks and briefs; requires MIN A to don shoes over ankles and MIN GUARD to to doff/don briefs sit>stand with RW             Functional mobility during ADLs: Min guard;Rolling walker                    Pertinent Vitals/Pain Pain Assessment: Faces Faces Pain Scale: Hurts a little bit Pain Location: Stomach Pain Descriptors / Indicators: Aching Pain Intervention(s): Limited activity within patient's tolerance;Monitored during session        Extremity/Trunk Assessment Upper Extremity Assessment Upper Extremity Assessment: Generalized weakness   Lower Extremity Assessment Lower Extremity Assessment: Generalized weakness       Communication Communication Communication: No difficulties   Cognition Arousal/Alertness: Awake/alert Behavior During Therapy: WFL for tasks assessed/performed Overall Cognitive Status: Within  Functional Limits for tasks assessed                                                Home Living Family/patient expects to be discharged to:: Private residence Living Arrangements: Alone Available Help at Discharge: Family;Available PRN/intermittently (Children live close by, but pt  reports they are only able to provide assistance intermittently) Type of Home: House Home Access: Stairs to enter [employer] of Steps: 1 Entrance Stairs-Rails: Left (grab bar to hold) Home Layout: One level     Bathroom Shower/Tub: Tub/shower unit (pt spongebathes at baseline)   Bathroom Toilet: Standard (standard toilet w/ toilet riser)     Home Equipment: Walker - 2 wheels;Cane - single point;Grab bars - toilet;Grab bars - tub/shower          Prior Functioning/Environment Level of Independence: Needs assistance  Gait / Transfers Assistance Needed: Pt ambulates limited distances without assistive device. Pt endorses fear of falling ADL's / Homemaking Assistance Needed: Independent with ADLs and able to perform light housekeeping and prepare simple meals. Receives assist for other IADLs from children.            OT Problem List: Decreased activity tolerance;Impaired balance (sitting and/or standing)      OT Treatment/Interventions: Self-care/ADL training;Therapeutic exercise;Energy conservation;DME and/or AE instruction;Therapeutic activities;Patient/family education;Balance training    OT Goals(Current goals can be found in the care plan section) Acute Rehab OT Goals Patient Stated Goal: to get better OT Goal Formulation: With patient Time For Goal Achievement: 05/30/20 Potential to Achieve Goals: Good ADL Goals Pt Will Perform Grooming: with modified independence;standing Pt Will Transfer to Toilet: with modified independence;ambulating;regular height toilet;grab bars Pt Will Perform Toileting - Clothing Manipulation and hygiene: with modified independence;sitting/lateral leans  OT Frequency: Min 1X/week    AM-PAC OT "6 Clicks" Daily Activity     Outcome Measure Help from another person eating meals?: None Help from another person taking care of personal grooming?: None Help from another person toileting, which includes using toliet, bedpan, or urinal?: A  Little Help from another person bathing (including washing, rinsing, drying)?: A Little Help from another person to put on and taking off regular upper body clothing?: A Little Help from another person to put on and taking off regular lower body clothing?: A Little 6 Click Score: 20   End of Session Equipment Utilized During Treatment: Rolling walker Nurse Communication: Mobility status  Activity Tolerance: Patient tolerated treatment well Patient left: in chair;with call bell/phone within reach;with chair alarm set  OT Visit Diagnosis: Unsteadiness on feet (R26.81);Muscle weakness (generalized) (M62.81)                Time: 1020-1107 OT Time Calculation (min): 47 min Charges:  OT General Charges $OT Visit: 1 Visit OT Evaluation $OT Eval Moderate Complexity: 1 Mod OT Treatments $Self Care/Home Management : 38-52 mins  Fredirick Maudlin, OTR/L [place]

## 2020-05-16 NOTE — Progress Notes (Addendum)
PROGRESS NOTE   HPI was taken from Dr. Francine Graven: Paula Powell is a 87 y.o. female with medical history significant for hypertension, depression, osteoarthritis who presents to the ER for evaluation of shortness of breath for about 4 days.  Patient noticed shortness of breath with exertion that has worsened to shortness of breath at rest.  She has some mild lower extremity swelling and has also noted that she has to sit up in bed or use multiple pillows to keep her head elevated while she sleeps. She was recently treated for UTI with antibiotic therapy and was on cephalexin, ciprofloxacin which was switched to Bactrim due to poor tolerance. She continues to have frequency of urination and suprapubic pain but denies having any dysuria, she has chest pain mostly when she coughs but denies having any nausea, no vomiting, no palpitations, no diaphoresis, no headache, no dizziness, no lightheadedness, no blurred vision or any focal deficits. She has a history of nicotine dependence but quit about 3 days prior to her admission because she has not felt well. Labs show sodium 137, potassium 3.5, chloride 103, bicarb 21, glucose 97, BUN 14, creatinine 1.45, calcium 9.0, BNP 2183, troponin I 13, procalcitonin less than 0.10, white count 5.5, hemoglobin 10.8, hematocrit 33.5, MCV 90.8, RDW 13.5, platelet count 248 Respiratory viral panel is negative Chest x-ray reviewed by me shows mild bibasilar opacification likely small effusions with associated atelectasis. Infection in the lung bases is possible. Twelve-lead EKG reviewed by me shows sinus rhythm with PVCs.  ED Course: Patient is an 87 year old Caucasian female who presents to the emergency room for evaluation of shortness of breath which was initially with exertion but now she is short of breath at rest associated with cough and pleuritic chest pain as well as orthopnea and bilateral lower extremity swelling.  BNP is elevated and chest x-ray shows bilateral  pleural effusions.  She received a dose of Lasix in the ER and will be admitted to the hospital for further evaluation.   CARRAH EPPOLITO  SEG:[id number] DOB: [birth year] DOA: 05/15/2020 PCP: Derinda Late, MD    Assessment & Plan:   Principal Problem:   Acute CHF (congestive heart failure) ([place]) Active Problems:   Nicotine dependence   Depressive disorder   Essential hypertension   Anemia of chronic disease   Elevated troponin    Acute CHF: unknown systolic vs diastolic vs combined. Echo ordered. Continue on IV lasix. Monitor I/Os and daily weights. Continue on home dose of losartan  HTN: continue on home dose of losartan  Elevated troponin: likely secondary to demand ischemia  ACD: likely secondary to CKD. H&H are stable   Likely CKD: baseline Cr/GFR is unknown. Currently stage IIIb. Will continue to monitor   Nicotine dependence: smoking cessation counseling    DVT prophylaxis: lovenox  Code Status: full  Family Communication:  Disposition Plan: depends on PT/OT recs   Level of care: Progressive Cardiac   Status is: Inpatient  Remains inpatient appropriate because:Unsafe d/c plan, IV treatments appropriate due to intensity of illness or inability to take PO and Inpatient level of care appropriate due to severity of illness   Dispo: The patient is from: Home              Anticipated d/c is to: SNF              Patient currently is not medically stable to d/c.   Difficult to place patient: I have no idea    Consultants:  Procedures:    Antimicrobials:    Subjective: Pt c/o shortness of breath   Objective: Vitals:   05/15/20 2330 05/16/20 0052 05/16/20 0100 05/16/20 0730  BP: (!) 145/84  (!) 152/90 132/77  Pulse: 72 71 86 82  Resp: (!) 22 (!) 25 17 20   Temp:      TempSrc:      SpO2: 99% 99% 99% 96%  Weight:      Height:       No intake or output data in the 24 hours ending 05/16/20 0831 Filed Weights   05/15/20 1415  Weight: 78 kg     Examination:  General exam: Appears calm and comfortable  Respiratory system: diminished breath sounds b/l  Cardiovascular system: S1 & S2 +. No rubs, gallops or clicks.  Gastrointestinal system: Abdomen is nondistended, soft and nontender. Normal bowel sounds heard. Central nervous system: Alert and oriented. Moves all 4 extremities  Psychiatry: Judgement and insight appear normal. Flat mood and affect     Data Reviewed: I have personally reviewed following labs and imaging studies  CBC: Recent Labs  Lab 05/15/20 1418  WBC 5.5  HGB 10.8*  HCT 33.5*  MCV 90.8  PLT 756   Basic Metabolic Panel: Recent Labs  Lab 05/15/20 1418  NA 137  K 3.5  CL 103  CO2 21*  GLUCOSE 97  BUN 14  CREATININE 1.45*  CALCIUM 9.0   GFR: Estimated Creatinine Clearance: 29.9 mL/min (A) (by C-G formula based on SCr of 1.45 mg/dL (H)). Liver Function Tests: No results for input(s): AST, ALT, ALKPHOS, BILITOT, PROT, ALBUMIN in the last 168 hours. No results for input(s): LIPASE, AMYLASE in the last 168 hours. No results for input(s): AMMONIA in the last 168 hours. Coagulation Profile: No results for input(s): INR, PROTIME in the last 168 hours. Cardiac Enzymes: No results for input(s): CKTOTAL, CKMB, CKMBINDEX, TROPONINI in the last 168 hours. BNP (last 3 results) No results for input(s): PROBNP in the last 8760 hours. HbA1C: No results for input(s): HGBA1C in the last 72 hours. CBG: No results for input(s): GLUCAP in the last 168 hours. Lipid Profile: No results for input(s): CHOL, HDL, LDLCALC, TRIG, CHOLHDL, LDLDIRECT in the last 72 hours. Thyroid Function Tests: No results for input(s): TSH, T4TOTAL, FREET4, T3FREE, THYROIDAB in the last 72 hours. Anemia Panel: No results for input(s): VITAMINB12, FOLATE, FERRITIN, TIBC, IRON, RETICCTPCT in the last 72 hours. Sepsis Labs: Recent Labs  Lab 05/15/20 1524  PROCALCITON <0.10    Recent Results (from the past 240 hour(s))  Resp  Panel by RT-PCR (Flu A&B, Covid) Nasopharyngeal Swab     Status: None   Collection Time: 05/15/20  3:16 PM   Specimen: Nasopharyngeal Swab; Nasopharyngeal(NP) swabs in vial transport medium  Result Value Ref Range Status   SARS Coronavirus 2 by RT PCR NEGATIVE NEGATIVE Final    Comment: (NOTE) SARS-CoV-2 target nucleic acids are NOT DETECTED.  The SARS-CoV-2 RNA is generally detectable in upper respiratory specimens during the acute phase of infection. The lowest concentration of SARS-CoV-2 viral copies this assay can detect is 138 copies/mL. A negative result does not preclude SARS-Cov-2 infection and should not be used as the sole basis for treatment or other patient management decisions. A negative result may occur with  improper specimen collection/handling, submission of specimen other than nasopharyngeal swab, presence of viral mutation(s) within the areas targeted by this assay, and inadequate number of viral copies(<138 copies/mL). A negative result must be combined with clinical observations,  patient history, and epidemiological information. The expected result is Negative.  Fact Sheet for Patients:  EntrepreneurPulse.com.au  Fact Sheet for Healthcare Providers:  IncredibleEmployment.be  This test is no t yet approved or cleared by the [place] FDA and  has been authorized for detection and/or diagnosis of SARS-CoV-2 by FDA under an Emergency Use Authorization (EUA). This EUA will remain  in effect (meaning this test can be used) for the duration of the COVID-19 declaration under Section 564(b)(1) of the Act, 21 U.S.C.section 360bbb-3(b)(1), unless the authorization is terminated  or revoked sooner.       Influenza A by PCR NEGATIVE NEGATIVE Final   Influenza B by PCR NEGATIVE NEGATIVE Final    Comment: (NOTE) The Xpert Xpress SARS-CoV-2/FLU/RSV plus assay is intended as an aid in the diagnosis of influenza from Nasopharyngeal  swab specimens and should not be used as a sole basis for treatment. Nasal washings and aspirates are unacceptable for Xpert Xpress SARS-CoV-2/FLU/RSV testing.  Fact Sheet for Patients: EntrepreneurPulse.com.au  Fact Sheet for Healthcare Providers: IncredibleEmployment.be  This test is not yet approved or cleared by the [place] FDA and has been authorized for detection and/or diagnosis of SARS-CoV-2 by FDA under an Emergency Use Authorization (EUA). This EUA will remain in effect (meaning this test can be used) for the duration of the COVID-19 declaration under Section 564(b)(1) of the Act, 21 U.S.C. section 360bbb-3(b)(1), unless the authorization is terminated or revoked.  Performed at [facility], [address], [place], [place] [address]          Radiology Studies: DG Chest 2 View  Result Date: 05/15/2020 CLINICAL DATA:  Shortness of breath and cough 5 days. EXAM: CHEST - 2 VIEW COMPARISON:  08/23/2006 FINDINGS: Patient is slightly rotated to the left. Lungs are adequately inflated with minimal bibasilar opacification likely small amount of bilateral pleural fluid with associated atelectasis. Infection in the lung bases is possible. Cardiomediastinal silhouette and remainder of the exam is unchanged. IMPRESSION: Mild bibasilar opacification likely small effusions with associated atelectasis. Infection in the lung bases is possible. Electronically Signed   By: Marin Olp M.D.   On: 05/15/2020 15:23        Scheduled Meds: . clobetasol ointment   Topical BID  . enoxaparin (LOVENOX) injection  30 mg Subcutaneous Q24H  . furosemide  40 mg Intravenous Daily  . losartan  100 mg Oral Daily  . sodium chloride flush  3 mL Intravenous Q12H   Continuous Infusions: . sodium chloride       LOS: 1 day    Time spent: 33 mins     Wyvonnia Dusky, MD [facility] Pager [phone]  If 7PM-7AM, please  contact night-coverage 05/16/2020, 8:31 AM

## 2020-05-17 ENCOUNTER — Inpatient Hospital Stay (HOSPITAL_COMMUNITY)
Admission: EM | Admit: 2020-05-17 | Discharge: 2020-05-17 | Disposition: A | Discharge status: Home / Self Care | Payer: PPO | Source: Home / Self Care | Attending: Internal Medicine | Admitting: Internal Medicine

## 2020-05-17 DIAGNOSIS — I5031 Acute diastolic (congestive) heart failure: Secondary | ICD-10-CM

## 2020-05-17 DIAGNOSIS — F329 Major depressive disorder, single episode, unspecified: Secondary | ICD-10-CM | POA: Diagnosis not present

## 2020-05-17 DIAGNOSIS — I509 Heart failure, unspecified: Secondary | ICD-10-CM | POA: Diagnosis not present

## 2020-05-17 DIAGNOSIS — I10 Essential (primary) hypertension: Secondary | ICD-10-CM | POA: Diagnosis not present

## 2020-05-17 DIAGNOSIS — R778 Other specified abnormalities of plasma proteins: Secondary | ICD-10-CM | POA: Diagnosis not present

## 2020-05-17 DIAGNOSIS — I248 Other forms of acute ischemic heart disease: Secondary | ICD-10-CM

## 2020-05-17 LAB — CBC
HCT: 31.8 % — ABNORMAL LOW (ref 36.0–46.0)
Hemoglobin: 10.4 g/dL — ABNORMAL LOW (ref 12.0–15.0)
MCH: 29.5 pg (ref 26.0–34.0)
MCHC: 32.7 g/dL (ref 30.0–36.0)
MCV: 90.1 fL (ref 80.0–100.0)
Platelets: 225 10*3/uL (ref 150–400)
RBC: 3.53 MIL/uL — ABNORMAL LOW (ref 3.87–5.11)
RDW: 13.4 % (ref 11.5–15.5)
WBC: 5 10*3/uL (ref 4.0–10.5)
nRBC: 0 % (ref 0.0–0.2)

## 2020-05-17 LAB — ECHOCARDIOGRAM COMPLETE
AR max vel: 0.72 cm2
AV Area VTI: 0.67 cm2
AV Area mean vel: 0.77 cm2
AV Mean grad: 28 mmHg
AV Peak grad: 49 mmHg
Ao pk vel: 3.5 m/s
Area-P 1/2: 2.58 cm2
Height: 66 in
MV VTI: 1.37 cm2
S' Lateral: 3.2 cm
Weight: 2625.6 oz

## 2020-05-17 LAB — BASIC METABOLIC PANEL
Anion gap: 10 (ref 5–15)
BUN: 24 mg/dL — ABNORMAL HIGH (ref 8–23)
CO2: 26 mmol/L (ref 22–32)
Calcium: 9.2 mg/dL (ref 8.9–10.3)
Chloride: 102 mmol/L (ref 98–111)
Creatinine, Ser: 1.66 mg/dL — ABNORMAL HIGH (ref 0.44–1.00)
GFR, Estimated: 30 mL/min — ABNORMAL LOW (ref >60)
Glucose, Bld: 86 mg/dL (ref 70–99)
Potassium: 3.3 mmol/L — ABNORMAL LOW (ref 3.5–5.1)
Sodium: 138 mmol/L (ref 135–145)

## 2020-05-17 LAB — C-REACTIVE PROTEIN: CRP: 1.6 mg/dL — ABNORMAL HIGH (ref <1.0)

## 2020-05-17 LAB — BRAIN NATRIURETIC PEPTIDE: B Natriuretic Peptide: 1001.2 pg/mL — ABNORMAL HIGH (ref 0.0–100.0)

## 2020-05-17 LAB — URINE CULTURE: Culture: NO GROWTH

## 2020-05-17 LAB — SEDIMENTATION RATE: Sed Rate: 59 mm/hr — ABNORMAL HIGH (ref 0–30)

## 2020-05-17 MED ORDER — POTASSIUM CHLORIDE CRYS ER 20 MEQ PO TBCR
40.0000 meq | EXTENDED_RELEASE_TABLET | Freq: Once | ORAL | Status: AC
  Administered 2020-05-17: 40 meq via ORAL
  Filled 2020-05-17: qty 2

## 2020-05-17 MED ORDER — METOPROLOL TARTRATE 25 MG PO TABS
12.5000 mg | ORAL_TABLET | Freq: Two times a day (BID) | ORAL | Status: DC
  Administered 2020-05-17 – 2020-05-19 (×4): 12.5 mg via ORAL
  Filled 2020-05-17 (×4): qty 1

## 2020-05-17 NOTE — Progress Notes (Signed)
OT Cancellation Note  Patient Details Name: Paula Powell MRN: [id number] DOB: Jun 27, 1932   Cancelled Treatment:    Reason Eval/Treat Not Completed: Medical issues which prohibited therapy. Per PT, this afternoon pt's HR increased from 87 to 147 with OOB transfer. Will attempt therapy at a later time/date, as patient is available and medically appropriate.  Josiah Lobo 05/17/2020, 2:20 PM

## 2020-05-17 NOTE — NC FL2 (Signed)
[place] LEVEL OF CARE SCREENING TOOL     IDENTIFICATION  Patient Name: Paula Powell Birthdate: 1932-10-26 Sex: female Admission Date (Current Location): 05/15/2020  [place] and [place] Number:  Engineering geologist and Address:  [facility], [address], [place], [place] [address]      Provider Number: [id number]  Attending Physician Name and Address:  Nolberto Hanlon, MD  Relative Name and Phone Number:       Current Level of Care: Hospital Recommended Level of Care: [place] Prior Approval Number:    Date Approved/Denied:   PASRR Number: [id number]  Discharge Plan: SNF    Current Diagnoses: Patient Active Problem List   Diagnosis Date Noted  . Acute CHF (congestive heart failure) ([place]) 05/15/2020  . Nicotine dependence 05/15/2020  . Anemia of chronic disease 05/15/2020  . Elevated troponin 05/15/2020  . Depressive disorder   . Essential hypertension   . Primary osteoarthritis of right knee 08/21/2016    Orientation RESPIRATION BLADDER Height & Weight     Self,Time,Situation,Place  O2 ([place] 1.5L) External catheter,Incontinent (placed 4/4) Weight: 163 lb 9.3 oz (74.2 kg) Height:  5\' 6"  (167.6 cm)  BEHAVIORAL SYMPTOMS/MOOD NEUROLOGICAL BOWEL NUTRITION STATUS      Continent Diet (2 gram sodium, thin liquids)  AMBULATORY STATUS COMMUNICATION OF NEEDS Skin   Limited Assist Verbally Normal                       Personal Care Assistance Level of Assistance  Dressing,Bathing,Feeding Bathing Assistance: Limited assistance Feeding assistance: Independent Dressing Assistance: Limited assistance     Functional Limitations Info  Sight,Speech,Hearing Sight Info: Adequate Hearing Info: Adequate Speech Info: Adequate    SPECIAL CARE FACTORS FREQUENCY  PT (By licensed PT),OT (By licensed OT)     PT Frequency: 5x OT Frequency: 5x            Contractures Contractures Info: Not present     Additional Factors Info  Code Status,Allergies Code Status Info: full code Allergies Info: Ace Inhibitors, Ciprofloxacin           Current Medications (05/17/2020):  This is the current hospital active medication list Current Facility-Administered Medications  Medication Dose Route Frequency Provider Last Rate Last Admin  . 0.9 %  sodium chloride infusion  250 mL Intravenous PRN Agbata, Tochukwu, MD      . acetaminophen (TYLENOL) tablet 650 mg  650 mg Oral Q4H PRN Agbata, Tochukwu, MD      . clobetasol ointment (TEMOVATE) 0.05 %   Topical BID Agbata, Tochukwu, MD      . cyclobenzaprine (FLEXERIL) tablet 10 mg  10 mg Oral TID PRN Agbata, Tochukwu, MD      . enoxaparin (LOVENOX) injection 30 mg  30 mg Subcutaneous Q24H Virl Cagey E, RPH   30 mg at 05/16/20 2212  . furosemide (LASIX) injection 40 mg  40 mg Intravenous Daily Agbata, Tochukwu, MD   40 mg at 05/17/20 0947  . losartan (COZAAR) tablet 100 mg  100 mg Oral Daily Agbata, Tochukwu, MD   100 mg at 05/17/20 0946  . MEDLINE mouth rinse  15 mL Mouth Rinse BID Agbata, Tochukwu, MD   15 mL at 05/17/20 0947  . ondansetron (ZOFRAN) injection 4 mg  4 mg Intravenous Q6H PRN Agbata, Tochukwu, MD      . sodium chloride flush (NS) 0.9 % injection 3 mL  3 mL Intravenous Q12H Agbata, Tochukwu, MD   3  mL at 05/17/20 0947  . sodium chloride flush (NS) 0.9 % injection 3 mL  3 mL Intravenous PRN Agbata, Tochukwu, MD         Discharge Medications: Please see discharge summary for a list of discharge medications.  Relevant Imaging Results:  Relevant Lab Results:   Additional Information [id number]  Eileen Stanford, LCSW

## 2020-05-17 NOTE — Progress Notes (Signed)
*  PRELIMINARY RESULTS* Echocardiogram 2D Echocardiogram has been performed.  Wallie Char Mico Spark 05/17/2020, 9:13 AM

## 2020-05-17 NOTE — Consult Note (Addendum)
   Heart Failure Nurse Navigator Note  HFpEf 50-55%.  Grade 2 diastolic dysfunction.  Moderate LVH.  Moderate left atrial enlargement.  Moderate to severe aortic stenosis.  Mild mitral regurgitation  She presented to the emergency room with complaints of 4 days of shortness of breath with exertion and also at rest.  She also noted some mild lower extremity edema.,  PND and orthopnea.  Comorbidities:  Hypertension Depression Osteoarthritis Anemia   She has a history of tobacco abuse.  Medications:  Furosemide 40 mg IV daily Losartan 100 mg daily  Labs:  Sodium 138, potassium 3.3, chloride 102, CO2 26, BUN 24 up from 17, creatinine 1.66 Intake 1520 Output 1050 mL Weight is 74.4 kg down from 78 g Blood pressure 128/69.  Assessment:  General-she is awake and alert lying in bed having just finished her lunch.  She is in no acute distress.  HEENT-pupils are equal, normocephalic.  Cardiac-heart rate slightly irregular, I9-C7, systolic murmur best heard in the right upper sternal border.  Chest-lungs are clear to posterior auscultation.  Abdomen-soft nontender.  Legs-there is no lower extremity edema.  Psych-is pleasant and appropriate makes good eye contact.  Neurologic-speech is clear  Initial meeting with patient today.  She states that she currently lives alone, she does not do a lot in the way of cooking or baking anymore she does not feel that she has the stamina to do that.  She no longer salts her food.  She states in the past that she has quit smoking may last up to a few months and then when she is around somebody that smokes she starts up again.   She says due to not feeling well she had quit smoking prior to the this admission is hopeful she can continue to be smoke-free.  She is concerned that she feels that she should not be living alone and feels unsafe and is wanting to talk to a Education officer, museum about placement.  Discussed weighing daily, signs and symptoms  to report to physician.  She voices understanding.  Discussed the outpatient heart failure clinic.   She was given an appointment along with information about the heart failure clinic.  She was also given the living with heart failure teaching booklet along with his zone magnet.  She had no further questions at this time, we will continue to follow.  Pricilla Riffle RN CHFN

## 2020-05-17 NOTE — Progress Notes (Signed)
PROGRESS NOTE   HPI was taken from Dr. Francine Graven: Paula Powell is a 87 y.o. female with medical history significant for hypertension, depression, osteoarthritis who presents to the ER for evaluation of shortness of breath for about 4 days.  Patient noticed shortness of breath with exertion that has worsened to shortness of breath at rest.  She has some mild lower extremity swelling and has also noted that she has to sit up in bed or use multiple pillows to keep her head elevated while she sleeps. She was recently treated for UTI with antibiotic therapy and was on cephalexin, ciprofloxacin which was switched to Bactrim due to poor tolerance. She continues to have frequency of urination and suprapubic pain but denies having any dysuria, she has chest pain mostly when she coughs but denies having any nausea, no vomiting, no palpitations, no diaphoresis, no headache, no dizziness, no lightheadedness, no blurred vision or any focal deficits. She has a history of nicotine dependence but quit about 3 days prior to her admission because she has not felt well. Labs show sodium 137, potassium 3.5, chloride 103, bicarb 21, glucose 97, BUN 14, creatinine 1.45, calcium 9.0, BNP 2183, troponin I 13, procalcitonin less than 0.10, white count 5.5, hemoglobin 10.8, hematocrit 33.5, MCV 90.8, RDW 13.5, platelet count 248 Respiratory viral panel is negative Chest x-ray reviewed by me shows mild bibasilar opacification likely small effusions with associated atelectasis. Infection in the lung bases is possible. Twelve-lead EKG reviewed by me shows sinus rhythm with PVCs.  ED Course: Patient is an 87 year old Caucasian female who presents to the emergency room for evaluation of shortness of breath which was initially with exertion but now she is short of breath at rest associated with cough and pleuritic chest pain as well as orthopnea and bilateral lower extremity swelling.  BNP is elevated and chest x-ray shows bilateral  pleural effusions.  She received a dose of Lasix in the ER and will be admitted to the hospital for further evaluation.  4/5- breathing better. Not at baseline. No cp.    [name]  ZWC:[id number] DOB: 1932-10-04 DOA: 05/15/2020 PCP: Derinda Late, MD    Assessment & Plan:   Principal Problem:   Acute CHF (congestive heart failure) ([place]) Active Problems:   Nicotine dependence   Depressive disorder   Essential hypertension   Anemia of chronic disease   Elevated troponin    Acute diastolic HF:  Diastolic grade II dysfunction Ef 50-55% -low nml., Mod-sev. AS Still volume overloaded, not at baseline BNP elevated Cardiology consulted Continue iv lasix I/o Powell wt  HTN: continue losartan, once bp stable stable resume beta-blocker at low dose   AS-mod-severe on echo Will need outpatient monitoring with cardiology    Elevated troponin:likely 2/2 demand ischemia.  But may need ischemic w/u    ACD: likely secondary to CKD. H&H are stable   Likely CKD: baseline Cr/GFR is unknown. Currently stage IIIb. Will continue to monitor   Nicotine dependence: smoking cessation counseling    DVT prophylaxis: lovenox  Code Status: full  Family Communication:  Disposition Plan: depends on PT/OT recs   Level of care: Progressive Cardiac   Status is: Inpatient  Remains inpatient appropriate because:Unsafe d/c plan, IV treatments appropriate due to intensity of illness or inability to take PO and Inpatient level of care appropriate due to severity of illness   Dispo: The patient is from: Home              Anticipated d/c  is to: SNF              Patient currently is not medically stable to d/c.   Difficult to place patient: NO    Consultants:   cardiology  Procedures:    Antimicrobials:    Subjective: Breathing better, but not at baseline  Objective: Vitals:   05/17/20 0600 05/17/20 0715 05/17/20 0942 05/17/20 1151  BP: 137/67 117/71  128/69  Pulse:  65   76  Resp: 18 18  (!) 22  Temp:  98.9 F (37.2 C)  98 F (36.7 C)  TempSrc:  Oral  Oral  SpO2:  95%  96%  Weight:   74.2 kg   Height:        Intake/Output Summary (Last 24 hours) at 05/17/2020 1500 Last data filed at 05/17/2020 1345 Gross per 24 hour  Intake 1400 ml  Output 1500 ml  Net -100 ml   Filed Weights   05/15/20 1415 05/16/20 0831 05/17/20 0942  Weight: 78 kg 74.4 kg 74.2 kg    Examination: Calm, comfortable Scattered Rales at bases, no wheezing Regular S1-S2 Harsh 3 out of 6 systolic murmur left sternal base Soft benign positive bowel sounds  mild edema bilaterally Alert oriented x3 Mood and affect appropriate in current setting     Data Reviewed: I have personally reviewed following labs and imaging studies  CBC: Recent Labs  Lab 05/15/20 1418 05/16/20 0844 05/17/20 0442  WBC 5.5 6.4 5.0  HGB 10.8* 11.1* 10.4*  HCT 33.5* 34.1* 31.8*  MCV 90.8 90.9 90.1  PLT 248 238 250   Basic Metabolic Panel: Recent Labs  Lab 05/15/20 1418 05/16/20 0844 05/17/20 0442  NA 137 136 138  K 3.5 3.5 3.3*  CL 103 103 102  CO2 21* 23 26  GLUCOSE 97 89 86  BUN 14 17 24*  CREATININE 1.45* 1.65* 1.66*  CALCIUM 9.0 8.9 9.2   GFR: Estimated Creatinine Clearance: 25.5 mL/min (A) (by C-G formula based on SCr of 1.66 mg/dL (H)). Liver Function Tests: No results for input(s): AST, ALT, ALKPHOS, BILITOT, PROT, ALBUMIN in the last 168 hours. No results for input(s): LIPASE, AMYLASE in the last 168 hours. No results for input(s): AMMONIA in the last 168 hours. Coagulation Profile: No results for input(s): INR, PROTIME in the last 168 hours. Cardiac Enzymes: No results for input(s): CKTOTAL, CKMB, CKMBINDEX, TROPONINI in the last 168 hours. BNP (last 3 results) No results for input(s): PROBNP in the last 8760 hours. HbA1C: No results for input(s): HGBA1C in the last 72 hours. CBG: No results for input(s): GLUCAP in the last 168 hours. Lipid Profile: No results for  input(s): CHOL, HDL, LDLCALC, TRIG, CHOLHDL, LDLDIRECT in the last 72 hours. Thyroid Function Tests: No results for input(s): TSH, T4TOTAL, FREET4, T3FREE, THYROIDAB in the last 72 hours. Anemia Panel: No results for input(s): VITAMINB12, FOLATE, FERRITIN, TIBC, IRON, RETICCTPCT in the last 72 hours. Sepsis Labs: Recent Labs  Lab 05/15/20 1524  PROCALCITON <0.10    Recent Results (from the past 240 hour(s))  Resp Panel by RT-PCR (Flu A&B, Covid) Nasopharyngeal Swab     Status: None   Collection Time: 05/15/20  3:16 PM   Specimen: Nasopharyngeal Swab; Nasopharyngeal(NP) swabs in vial transport medium  Result Value Ref Range Status   SARS Coronavirus 2 by RT PCR NEGATIVE NEGATIVE Final    Comment: (NOTE) SARS-CoV-2 target nucleic acids are NOT DETECTED.  The SARS-CoV-2 RNA is generally detectable in upper respiratory specimens during the acute  phase of infection. The lowest concentration of SARS-CoV-2 viral copies this assay can detect is 138 copies/mL. A negative result does not preclude SARS-Cov-2 infection and should not be used as the sole basis for treatment or other patient management decisions. A negative result may occur with  improper specimen collection/handling, submission of specimen other than nasopharyngeal swab, presence of viral mutation(s) within the areas targeted by this assay, and inadequate number of viral copies(<138 copies/mL). A negative result must be combined with clinical observations, patient history, and epidemiological information. The expected result is Negative.  Fact Sheet for Patients:  EntrepreneurPulse.com.au  Fact Sheet for Healthcare Providers:  IncredibleEmployment.be  This test is no t yet approved or cleared by the [place] FDA and  has been authorized for detection and/or diagnosis of SARS-CoV-2 by FDA under an Emergency Use Authorization (EUA). This EUA will remain  in effect (meaning this test  can be used) for the duration of the COVID-19 declaration under Section 564(b)(1) of the Act, 21 U.S.C.section 360bbb-3(b)(1), unless the authorization is terminated  or revoked sooner.       Influenza A by PCR NEGATIVE NEGATIVE Final   Influenza B by PCR NEGATIVE NEGATIVE Final    Comment: (NOTE) The Xpert Xpress SARS-CoV-2/FLU/RSV plus assay is intended as an aid in the diagnosis of influenza from Nasopharyngeal swab specimens and should not be used as a sole basis for treatment. Nasal washings and aspirates are unacceptable for Xpert Xpress SARS-CoV-2/FLU/RSV testing.  Fact Sheet for Patients: EntrepreneurPulse.com.au  Fact Sheet for Healthcare Providers: IncredibleEmployment.be  This test is not yet approved or cleared by the [place] FDA and has been authorized for detection and/or diagnosis of SARS-CoV-2 by FDA under an Emergency Use Authorization (EUA). This EUA will remain in effect (meaning this test can be used) for the duration of the COVID-19 declaration under Section 564(b)(1) of the Act, 21 U.S.C. section 360bbb-3(b)(1), unless the authorization is terminated or revoked.  Performed at [facility], [address] [place], [place] [address]   Urine Culture     Status: None   Collection Time: 05/15/20  7:22 PM   Specimen: Urine, Random  Result Value Ref Range Status   Specimen Description   Final    URINE, RANDOM Performed at [facility], [address] [place], [place] [address]    Special Requests   Final    NONE Performed at [facility], [address], [place], [place] [address]    Culture   Final    NO GROWTH Performed at [facility], [place] [address] [place], [place] [address]    Report Status 05/17/2020 FINAL  Final         Radiology Studies: ECHOCARDIOGRAM COMPLETE  Result Date: 05/17/2020    ECHOCARDIOGRAM REPORT   Patient Name:   Paula Powell Jock Date of Exam:  05/17/2020 Medical Rec #:  [id number]     Height:       66.0 in Accession #:    [id number]    Weight:       164.1 lb Date of Birth:  21-Jun-1932     BSA:          1.839 m Patient Age:    34 years      BP:           148/68 mmHg Patient Gender: F             HR:  76 bpm. Exam Location:  [facility] Procedure: 2D Echo, Color Doppler and Cardiac Doppler Indications:     I50.31 CHF-Acute Diastolic  History:         Patient has no prior history of Echocardiogram examinations.                  Risk Factors:Hypertension.  Sonographer:     Charmayne Sheer RDCS (AE) Referring Phys:  [id number] Collier Bullock Diagnosing Phys: Nelva Bush MD IMPRESSIONS  1. Left ventricular ejection fraction, by estimation, is 50 to 55%. The left ventricle has low normal function. Left ventricular endocardial border not optimally defined to evaluate regional wall motion. There is moderate left ventricular hypertrophy. Left ventricular diastolic parameters are consistent with Grade II diastolic dysfunction (pseudonormalization). Elevated left atrial pressure.  2. Right ventricular systolic function is normal. The right ventricular size is normal. Tricuspid regurgitation signal is inadequate for assessing PA pressure.  3. Left atrial size was moderately dilated.  4. The pericardial effusion is posterior to the left ventricle.  5. The mitral valve is degenerative. Mild mitral valve regurgitation. No evidence of mitral stenosis. Severe mitral annular calcification.  6. The aortic valve is tricuspid. There is moderate calcification of the aortic valve. There is moderate thickening of the aortic valve. Aortic valve regurgitation is mild. Moderate to severe aortic valve stenosis. Aortic valve area, by VTI measures 0.67 cm. Aortic valve mean gradient measures 28.0 mmHg. Aortic valve Vmax measures 3.50 m/s.  7. The inferior vena cava is normal in size with greater than 50% respiratory variability, suggesting right atrial pressure of 3 mmHg. FINDINGS  Left  Ventricle: Left ventricular ejection fraction, by estimation, is 50 to 55%. The left ventricle has low normal function. Left ventricular endocardial border not optimally defined to evaluate regional wall motion. The left ventricular internal cavity  size was normal in size. There is moderate left ventricular hypertrophy. Left ventricular diastolic parameters are consistent with Grade II diastolic dysfunction (pseudonormalization). Elevated left atrial pressure. Right Ventricle: The right ventricular size is normal. No increase in right ventricular wall thickness. Right ventricular systolic function is normal. Tricuspid regurgitation signal is inadequate for assessing PA pressure. Left Atrium: Left atrial size was moderately dilated. Right Atrium: Right atrial size was normal in size. Pericardium: Trivial pericardial effusion is present. The pericardial effusion is posterior to the left ventricle. Mitral Valve: The mitral valve is degenerative in appearance. There is mild thickening of the mitral valve leaflet(s). Severe mitral annular calcification. Mild mitral valve regurgitation. No evidence of mitral valve stenosis. MV peak gradient, 5.3 mmHg.  The mean mitral valve gradient is 3.0 mmHg. Tricuspid Valve: The tricuspid valve is normal in structure. Tricuspid valve regurgitation is mild. Aortic Valve: The aortic valve is tricuspid. There is moderate calcification of the aortic valve. There is moderate thickening of the aortic valve. Aortic valve regurgitation is mild. Moderate to severe aortic stenosis is present. Aortic valve mean gradient measures 28.0 mmHg. Aortic valve peak gradient measures 49.0 mmHg. Aortic valve area, by VTI measures 0.67 cm. Pulmonic Valve: The pulmonic valve was normal in structure. Pulmonic valve regurgitation is trivial. No evidence of pulmonic stenosis. Aorta: The aortic root is normal in size and structure. Pulmonary Artery: The pulmonary artery is of normal size. Venous: The inferior  vena cava is normal in size with greater than 50% respiratory variability, suggesting right atrial pressure of 3 mmHg. IAS/Shunts: No atrial level shunt detected by color flow Doppler.  LEFT VENTRICLE PLAX 2D LVIDd:  3.60 cm  Diastology LVIDs:         3.20 cm  LV e' medial:    3.26 cm/s LV PW:         1.50 cm  LV E/e' medial:  34.7 LV IVS:        1.39 cm  LV e' lateral:   6.09 cm/s LVOT diam:     1.90 cm  LV E/e' lateral: 18.6 LV SV:         49 LV SV Index:   27 LVOT Area:     2.84 cm  RIGHT VENTRICLE RV Basal diam:  2.70 cm LEFT ATRIUM             Index       RIGHT ATRIUM           Index LA diam:        4.70 cm 2.56 cm/m  RA Area:     12.50 cm LA Vol (A2C):   72.7 ml 39.54 ml/m RA Volume:   27.60 ml  15.01 ml/m LA Vol (A4C):   97.0 ml 52.76 ml/m LA Biplane Vol: 86.8 ml 47.21 ml/m  AORTIC VALVE                    PULMONIC VALVE AV Area (Vmax):    0.72 cm     PV Vmax:       2.60 m/s AV Area (Vmean):   0.77 cm     PV Vmean:      191.000 cm/s AV Area (VTI):     0.67 cm     PV VTI:        0.546 m AV Vmax:           350.00 cm/s  PV Peak grad:  27.0 mmHg AV Vmean:          241.500 cm/s PV Mean grad:  16.0 mmHg AV VTI:            0.723 m AV Peak Grad:      49.0 mmHg AV Mean Grad:      28.0 mmHg LVOT Vmax:         88.80 cm/s LVOT Vmean:        65.200 cm/s LVOT VTI:          0.172 m LVOT/AV VTI ratio: 0.24  AORTA Ao Root diam: 3.00 cm MITRAL VALVE MV Area (PHT): 2.58 cm     SHUNTS MV Area VTI:   1.37 cm     Systemic VTI:  0.17 m MV Peak grad:  5.3 mmHg     Systemic Diam: 1.90 cm MV Mean grad:  3.0 mmHg MV Vmax:       1.15 m/s MV Vmean:      77.1 cm/s MV Decel Time: 294 msec MV E velocity: 113.00 cm/s MV A velocity: 99.00 cm/s MV E/A ratio:  1.14 Christopher End MD Electronically signed by Nelva Bush MD Signature Date/Time: 05/17/2020/10:48:29 AM    Final         Scheduled Meds: . clobetasol ointment   Topical BID  . enoxaparin (LOVENOX) injection  30 mg Subcutaneous Q24H  . furosemide  40 mg  Intravenous Powell  . losartan  100 mg Oral Powell  . mouth rinse  15 mL Mouth Rinse BID  . sodium chloride flush  3 mL Intravenous Q12H   Continuous Infusions: . sodium chloride       LOS: 2 days    Time spent: 35-minute  with >50% on coc    Nolberto Hanlon, MD [facility] Pager [phone]  If 7PM-7AM, please contact night-coverage 05/17/2020, 3:00 PM

## 2020-05-17 NOTE — Progress Notes (Signed)
Physical Therapy Treatment Patient Details Name: Paula Powell MRN: [id number] DOB: 1932/11/26 Today's Date: 05/17/2020    History of Present Illness 87 y.o. female with medical history significant for hypertension, depression, osteoarthritis who presents to the ER for evaluation of shortness of breath, orthopnea and bilateral lower extremity swelling.    PT Comments    Pt was long sitting in bed upon arriving. She agrees to PT session and is cooperative and pleasant throughout. Resting HR 82 BPM with sao2 95% on 2 L O2. Pt agrees to OOB activity. Easily able to exit L side of bed and stand to RW. Began ambulation however quickly discontinued due to pt having run of V tach with HR elevation to 147 BPM. Pt did endorse feeling extremely fatigued. RN staff notified. Acute PT will continue to follow and progress as able per pt tolerance. Recommend DC to SNF to assist pt to PLOF prior to returning home.    Follow Up Recommendations  SNF;Supervision/Assistance - 24 hour     Equipment Recommendations  None recommended by PT       Precautions / Restrictions Precautions Precautions: Fall Restrictions Weight Bearing Restrictions: No    Mobility  Bed Mobility Overal bed mobility: Needs Assistance Bed Mobility: Supine to Sit;Sit to Supine     Supine to sit: HOB elevated;Supervision Sit to supine: Supervision;HOB elevated   General bed mobility comments: Pt was easily able to exit bed without physical assistance. did have HOB elevated.    Transfers Overall transfer level: Needs assistance Equipment used: Rolling walker (2 wheeled) Transfers: Sit to/from Stand Sit to Stand: Supervision         General transfer comment: Pt's resting HR was 82 BPM with sao2 95% on O2.  Ambulation/Gait Ambulation/Gait assistance: Min guard Gait Distance (Feet): 5 Feet Assistive device: Rolling walker (2 wheeled) Gait Pattern/deviations: Step-through pattern;Trunk flexed Gait velocity: slightly  decreased   General Gait Details: Pt started to ambulate however has Vtach with HR elevation to 147 from 82bpm quickly. pt returned to bed and RN notified.       Balance Overall balance assessment: Needs assistance Sitting-balance support: Feet supported Sitting balance-Leahy Scale: Normal Sitting balance - Comments: no sitting balance deficits observed   Standing balance support: Bilateral upper extremity supported;During functional activity Standing balance-Leahy Scale: Fair Standing balance comment: min guard and use of rolling walker for ambulation. Unsteady when not using walker          Cognition Arousal/Alertness: Awake/alert Behavior During Therapy: WFL for tasks assessed/performed Overall Cognitive Status: Within Functional Limits for tasks assessed              General Comments: Pt is A and O x 4 and very pleasant             Pertinent Vitals/Pain Pain Assessment: No/denies pain Pain Score: 0-No pain           PT Goals (current goals can now be found in the care plan section) Acute Rehab PT Goals Patient Stated Goal: to get better Progress towards PT goals: Progressing toward goals    Frequency    Min 2X/week      PT Plan Current plan remains appropriate       AM-PAC PT "6 Clicks" Mobility   Outcome Measure  Help needed turning from your back to your side while in a flat bed without using bedrails?: A Little Help needed moving from lying on your back to sitting on the side of a flat bed without  using bedrails?: A Little Help needed moving to and from a bed to a chair (including a wheelchair)?: A Little Help needed standing up from a chair using your arms (e.g., wheelchair or bedside chair)?: A Little Help needed to walk in hospital room?: A Little Help needed climbing 3-5 steps with a railing? : A Little 6 Click Score: 18    End of Session Equipment Utilized During Treatment: Gait belt;Oxygen (2 L) Activity Tolerance: Patient limited  by fatigue Patient left: in bed;with call bell/phone within reach;with bed alarm set Nurse Communication: Mobility status PT Visit Diagnosis: Unsteadiness on feet (R26.81);Difficulty in walking, not elsewhere classified (R26.2)     Time: 7290-2111 PT Time Calculation (min) (ACUTE ONLY): 24 min  Charges:  $Therapeutic Activity: 23-37 mins                     Julaine Fusi PTA 05/17/20, 1:37 PM

## 2020-05-17 NOTE — Progress Notes (Signed)
PT Cancellation Note  Patient Details Name: Paula Powell MRN: [id number] DOB: 06-30-1932   Cancelled Treatment:      PT attempt. Pt requested author return at 11am. Will treat pt at that time.  Willette Pa 05/17/2020, 9:51 AM

## 2020-05-17 NOTE — Consult Note (Signed)
Cardiology Consultation:   Patient ID: Paula Powell MRN: [id number]; DOB: 05/03/1932  Admit date: 05/15/2020 Date of Consult: 05/17/2020  PCP:  Derinda Late, MD   [place]  Cardiologist:  No primary care provider on file.  Advanced Practice Provider:  No care team member to display Electrophysiologist:  None    Patient Profile:   Paula Powell is a 87 y.o. female with a hx of HTN, depression, OA, tobacco use who is being seen today for the evaluation of CHF at the request of Dr. Kurtis Bushman.  History of Present Illness:   [name] has not been seen by cardiology in the past. No prior ischemic work-up. She smoked most of her adult life, and recently quit (about a week ago). No alcohol or drug history. She lives by herself and is able to take care of herself. She has help if it's necessary. She regularly follows with PCP. Family history negative for CAD, but does have an uncle with valve disorder.   The patient presented to the ER at [facility] 05/15/20 for shortness of breath that started 6 days ago. She saw her PCP 2 days later who did not feel she needed to go the ER, however symptoms worsened. It was on exertion but worsened and she was sob of rest. Also noted some lower extremity edema and orthopnea. Recently treated for UTI with abx. Denies fever chills, dizziness, lightheadedness, nasuea, vomiting. She did report some left-sided sharp chest pain that started with the SOB. It was worse with inspiration and positioning. Unsure if it was worse with exertion since she was unable to walk far. Also had some palpitations.  In the ED patient was hypertensive. Labs showed potassium 3.5, sodium 137, bicarb 21, creatinine 1.45, BUN 14, Hgb 10.8, WBC 5.5. BNP 2183, Troponin>108>113 113. Respiratory panel negative. CXR showed mild bibasilar opacification likely small effusions with associated atelectasis. EKG showed SR with PACs and no acute changes. She was given lasix and admitted  for further work-up.    Past Medical History:  Diagnosis Date  . Allergic eczema   . Anemia   . Carpal tunnel syndrome   . Chest pain, atypical   . Depression   . Depressive disorder   . Diverticulosis   . DJD (degenerative joint disease)   . Eczema   . Essential hypertension   . H/O: hysterectomy   . Hives   . IBD (inflammatory bowel disease)   . Irritable bowel syndrome   . PONV (postoperative nausea and vomiting)    left knee arthroscopy  . Rhinitis, allergic   . Right carotid bruit     Past Surgical History:  Procedure Laterality Date  . ABDOMINAL HYSTERECTOMY    . BACK SURGERY  2008   [facility]  . CARPAL TUNNEL RELEASE Left 1990's  . COLONOSCOPY WITH PROPOFOL N/A 07/22/2014   Procedure: COLONOSCOPY WITH PROPOFOL;  Surgeon: Hulen Luster, MD;  Location: [facility] ENDOSCOPY;  Service: Gastroenterology;  Laterality: N/A;  . JOINT REPLACEMENT    . OOPHORECTOMY Right 2003  . RECTAL SURGERY  2003  . TOTAL KNEE ARTHROPLASTY Right 08/21/2016   Procedure: TOTAL KNEE ARTHROPLASTY;  Surgeon: Hessie Knows, MD;  Location: [facility] ORS;  Service: Orthopedics;  Laterality: Right;     Home Medications:  Prior to Admission medications   Medication Sig Start Date End Date Taking? Authorizing Provider  clobetasol ointment (TEMOVATE) 0.05 % Apply topically 2 (two) times daily. 07/30/19 07/29/20 Yes [provider]  hydrochlorothiazide (HYDRODIURIL) 25  MG tablet Take 25 mg by mouth daily. In am.   Yes [provider]  losartan (COZAAR) 100 MG tablet Take 100 mg by mouth daily. In am.   Yes [provider]  sulfamethoxazole-trimethoprim (BACTRIM DS) 800-160 MG tablet Take 1 tablet by mouth 2 (two) times daily. 05/13/20  Yes [provider]  acetaminophen (TYLENOL) 500 MG tablet Take 1,000 mg by mouth every 6 (six) hours as needed for mild pain.    [provider]  citalopram (CELEXA) 20 MG tablet Take 30 mg by mouth daily. In am. Patient not taking: No sig  reported 05/21/16   [provider]  cyclobenzaprine (FLEXERIL) 10 MG tablet Take 10 mg by mouth 3 (three) times daily as needed for muscle spasms.    [provider]  enoxaparin (LOVENOX) 40 MG/0.4ML injection Inject 0.4 mLs (40 mg total) into the skin daily. 08/23/16 09/06/16  Duanne Guess, PA-C  oxyCODONE (OXY IR/ROXICODONE) 5 MG immediate release tablet Take 1-2 tablets (5-10 mg total) by mouth every 3 (three) hours as needed for breakthrough pain. Patient not taking: No sig reported 08/23/16   Duanne Guess, PA-C  traMADol (ULTRAM) 50 MG tablet Take 1 tablet (50 mg total) by mouth every 6 (six) hours as needed. Patient not taking: No sig reported 08/07/17   Carrie Mew, MD    Inpatient Medications: Scheduled Meds: . clobetasol ointment   Topical BID  . enoxaparin (LOVENOX) injection  30 mg Subcutaneous Q24H  . furosemide  40 mg Intravenous Daily  . losartan  100 mg Oral Daily  . mouth rinse  15 mL Mouth Rinse BID  . sodium chloride flush  3 mL Intravenous Q12H   Continuous Infusions: . sodium chloride     PRN Meds: sodium chloride, acetaminophen, cyclobenzaprine, ondansetron (ZOFRAN) IV, sodium chloride flush  Allergies:    Allergies  Allergen Reactions  . Ace Inhibitors Cough  . Ciprofloxacin Nausea Only    Social History:   Social History   Socioeconomic History  . Marital status: Widowed    Spouse name: Not on file  . Number of children: Not on file  . Years of education: Not on file  . Highest education level: Not on file  Occupational History  . Not on file  Tobacco Use  . Smoking status: Current Every Day Smoker    Packs/day: 1.00  . Smokeless tobacco: Never Used  Substance and Sexual Activity  . Alcohol use: No  . Drug use: No  . Sexual activity: Not on file  Other Topics Concern  . Not on file  Social History Narrative  . Not on file   Social Determinants of Health   Financial Resource Strain: Not on file  Food  Insecurity: Not on file  Transportation Needs: Not on file  Physical Activity: Not on file  Stress: Not on file  Social Connections: Not on file  Intimate Partner Violence: Not on file    Family History:    Family History  Problem Relation Age of Onset  . Breast cancer Paternal Grandmother      ROS:  Please see the history of present illness.   All other ROS reviewed and negative.     Physical Exam/Data:   Vitals:   05/17/20 0415 05/17/20 0600 05/17/20 0715 05/17/20 0942  BP: (!) 154/76 137/67 117/71   Pulse: 79  65   Resp: 16 18 18    Temp: 98 F (36.7 C)  98.9 F (37.2 C)   TempSrc:  Axillary  Oral   SpO2: 95%  95%   Weight:    74.2 kg  Height:        Intake/Output Summary (Last 24 hours) at 05/17/2020 0958 Last data filed at 05/17/2020 0500 Gross per 24 hour  Intake 1520 ml  Output 1050 ml  Net 470 ml   Last 3 Weights 05/17/2020 05/16/2020 05/15/2020  Weight (lbs) 163 lb 9.3 oz 164 lb 1.6 oz 172 lb  Weight (kg) 74.2 kg 74.435 kg 78.019 kg     Body mass index is 26.4 kg/m.  General:  Well nourished, well developed, in no acute distress HEENT: normal Lymph: no adenopathy Neck: minimal JVD Endocrine:  No thryomegaly Vascular: No carotid bruits; FA pulses 2+ bilaterally without bruits  Cardiac:  normal S1, S2; RRR; + murmur  Lungs:  Diminished at bases Abd: soft, nontender, no hepatomegaly  Ext: mild edema Musculoskeletal:  No deformities, BUE and BLE strength normal and equal Skin: warm and dry  Neuro:  CNs 2-12 intact, no focal abnormalities noted Psych:  Normal affect   EKG:  The EKG was personally reviewed and demonstrates:  Sr, 81bpm LVH, q waves ant leads, nonspecific  Wave changes, PACs Telemetry:  Telemetry was personally reviewed and demonstrates:  NSR, HR 60-70s, PACs Relevant CV Studies:  Echo 05/17/20   1. Left ventricular ejection fraction, by estimation, is 50 to 55%. The  left ventricle has low normal function. Left ventricular endocardial   border not optimally defined to evaluate regional wall motion. There is  moderate left ventricular hypertrophy.  Left ventricular diastolic parameters are consistent with Grade II  diastolic dysfunction (pseudonormalization). Elevated left atrial  pressure.  2. Right ventricular systolic function is normal. The right ventricular  size is normal. Tricuspid regurgitation signal is inadequate for assessing  PA pressure.  3. Left atrial size was moderately dilated.  4. The pericardial effusion is posterior to the left ventricle.  5. The mitral valve is degenerative. Mild mitral valve regurgitation. No  evidence of mitral stenosis. Severe mitral annular calcification.  6. The aortic valve is tricuspid. There is moderate calcification of the  aortic valve. There is moderate thickening of the aortic valve. Aortic  valve regurgitation is mild. Moderate to severe aortic valve stenosis.  Aortic valve area, by VTI measures  0.67 cm. Aortic valve mean gradient measures 28.0 mmHg. Aortic valve Vmax  measures 3.50 m/s.  7. The inferior vena cava is normal in size with greater than 50%  respiratory variability, suggesting right atrial pressure of 3 mmHg.   Laboratory Data:  High Sensitivity Troponin:   Recent Labs  Lab 05/15/20 1418 05/15/20 1703 05/15/20 1721  TROPONINIHS 113* 108* 113*     Chemistry Recent Labs  Lab 05/15/20 1418 05/16/20 0844 05/17/20 0442  NA 137 136 138  K 3.5 3.5 3.3*  CL 103 103 102  CO2 21* 23 26  GLUCOSE 97 89 86  BUN 14 17 24*  CREATININE 1.45* 1.65* 1.66*  CALCIUM 9.0 8.9 9.2  GFRNONAA 35* 30* 30*  ANIONGAP 13 10 10     No results for input(s): PROT, ALBUMIN, AST, ALT, ALKPHOS, BILITOT in the last 168 hours. Hematology Recent Labs  Lab 05/15/20 1418 05/16/20 0844 05/17/20 0442  WBC 5.5 6.4 5.0  RBC 3.69* 3.75* 3.53*  HGB 10.8* 11.1* 10.4*  HCT 33.5* 34.1* 31.8*  MCV 90.8 90.9 90.1  MCH 29.3 29.6 29.5  MCHC 32.2 32.6 32.7  RDW 13.5  13.4 13.4  PLT 248 238 225  BNP Recent Labs  Lab 05/15/20 1516  BNP 2,183.7*    DDimer No results for input(s): DDIMER in the last 168 hours.   Radiology/Studies:  DG Chest 2 View  Result Date: 05/15/2020 CLINICAL DATA:  Shortness of breath and cough 5 days. EXAM: CHEST - 2 VIEW COMPARISON:  08/23/2006 FINDINGS: Patient is slightly rotated to the left. Lungs are adequately inflated with minimal bibasilar opacification likely small amount of bilateral pleural fluid with associated atelectasis. Infection in the lung bases is possible. Cardiomediastinal silhouette and remainder of the exam is unchanged. IMPRESSION: Mild bibasilar opacification likely small effusions with associated atelectasis. Infection in the lung bases is possible. Electronically Signed   By: Marin Olp M.D.   On: 05/15/2020 15:23     Assessment and Plan:   Acute diastolic heart failure - presented with SOB, orthopnea, LLE. BNP elevated and CXR with possible volume overload.  - IV lasix 40mg  BID - Echo showed LVEF 50-55%, G2DD, mod LVH, mod dilated LA, pericardial effusion, mild MR, mild AR, mod to severe AS, mean gradient 5mmHg - BB held for acute exacerbation, can likely start at low dose - Home losartan continued - UOP -1L. Weight down 172lbs>>163lbs, unsure if this is accurate.  - Still has mild volume overload, would continue diuresis - creatinine stable - If creatinine doesn't improve may need to hold ARB  Mod to severe AS - mean gradient 77mmHg - murmur on exam - Might need further work-up, although can likely be done in OP setting. Unsure how aggressive patient wants to be, might just want conservative management. MD to see.   Trivial pericardial effusion - check sed rate and CRP - No recent fever, chills, illness   HTN - home losartan continued - can likely start BB  Elevated troponin - patient did reports some left sided chest pain, sounded more atypical - Chest CT in 2018 showed aortic  atherosclerosis and atherosclerosis of the coronary arteries - HS troponin elevated to 113, do not suspect acute ACS, suspect mroe demand ischemia given acute CHF, although OP ischemic evaluation would not be unreasonable  Chronic anemia - stable   For questions or updates, please contact [facility] Please consult [email or web address] for contact info under    Signed, Aston Lieske Ninfa Meeker, PA-C  05/17/2020 9:58 AM

## 2020-05-18 DIAGNOSIS — I5031 Acute diastolic (congestive) heart failure: Secondary | ICD-10-CM | POA: Diagnosis not present

## 2020-05-18 DIAGNOSIS — I350 Nonrheumatic aortic (valve) stenosis: Secondary | ICD-10-CM

## 2020-05-18 DIAGNOSIS — I10 Essential (primary) hypertension: Secondary | ICD-10-CM | POA: Diagnosis not present

## 2020-05-18 DIAGNOSIS — F17209 Nicotine dependence, unspecified, with unspecified nicotine-induced disorders: Secondary | ICD-10-CM | POA: Diagnosis not present

## 2020-05-18 LAB — BASIC METABOLIC PANEL
Anion gap: 9 (ref 5–15)
BUN: 28 mg/dL — ABNORMAL HIGH (ref 8–23)
CO2: 25 mmol/L (ref 22–32)
Calcium: 9.2 mg/dL (ref 8.9–10.3)
Chloride: 103 mmol/L (ref 98–111)
Creatinine, Ser: 1.66 mg/dL — ABNORMAL HIGH (ref 0.44–1.00)
GFR, Estimated: 30 mL/min — ABNORMAL LOW (ref >60)
Glucose, Bld: 91 mg/dL (ref 70–99)
Potassium: 3.9 mmol/L (ref 3.5–5.1)
Sodium: 137 mmol/L (ref 135–145)

## 2020-05-18 LAB — RESP PANEL BY RT-PCR (FLU A&B, COVID) ARPGX2
Influenza A by PCR: NEGATIVE
Influenza B by PCR: NEGATIVE
SARS Coronavirus 2 by RT PCR: NEGATIVE

## 2020-05-18 NOTE — Progress Notes (Signed)
Progress Note  Patient Name: Paula Powell Date of Encounter: 05/18/2020  [facility] Cardiologist: No primary care provider on file.   Subjective   Patient is feeling well this morning. No chest pain.   Inpatient Medications    Scheduled Meds: . clobetasol ointment   Topical BID  . enoxaparin (LOVENOX) injection  30 mg Subcutaneous Q24H  . losartan  100 mg Oral Daily  . mouth rinse  15 mL Mouth Rinse BID  . metoprolol tartrate  12.5 mg Oral BID  . sodium chloride flush  3 mL Intravenous Q12H   Continuous Infusions: . sodium chloride     PRN Meds: sodium chloride, acetaminophen, cyclobenzaprine, ondansetron (ZOFRAN) IV, sodium chloride flush   Vital Signs    Vitals:   05/17/20 1859 05/17/20 2042 05/18/20 0342 05/18/20 0727  BP: (!) 124/59 124/77 (!) 143/68 (!) 126/50  Pulse: 83 73 63 64  Resp: 20  20 19   Temp: 98.2 F (36.8 C)  97.9 F (36.6 C) 98.4 F (36.9 C)  TempSrc: Oral  Oral   SpO2: 96%  97% 100%  Weight:    74.3 kg  Height:        Intake/Output Summary (Last 24 hours) at 05/18/2020 0907 Last data filed at 05/17/2020 1830 Gross per 24 hour  Intake 720 ml  Output 1575 ml  Net -855 ml   Last 3 Weights 05/18/2020 05/17/2020 05/16/2020  Weight (lbs) 163 lb 12.8 oz 163 lb 9.3 oz 164 lb 1.6 oz  Weight (kg) 74.3 kg 74.2 kg 74.435 kg      Telemetry    NSr HR 60-70s- Personally Reviewed  ECG    No new - Personally Reviewed  Physical Exam   GEN: No acute distress.   Neck: No JVD Cardiac: RRR, + murmur, rubs, or gallops.  Respiratory: Clear to auscultation bilaterally. GI: Soft, nontender, non-distended  MS: No edema; No deformity. Neuro:  Nonfocal  Psych: Normal affect   Labs    High Sensitivity Troponin:   Recent Labs  Lab 05/15/20 1418 05/15/20 1703 05/15/20 1721  TROPONINIHS 113* 108* 113*      Chemistry Recent Labs  Lab 05/16/20 0844 05/17/20 0442 05/18/20 0441  NA 136 138 137  K 3.5 3.3* 3.9  CL 103 102 103  CO2 23 26 25    GLUCOSE 89 86 91  BUN 17 24* 28*  CREATININE 1.65* 1.66* 1.66*  CALCIUM 8.9 9.2 9.2  GFRNONAA 30* 30* 30*  ANIONGAP 10 10 9      Hematology Recent Labs  Lab 05/15/20 1418 05/16/20 0844 05/17/20 0442  WBC 5.5 6.4 5.0  RBC 3.69* 3.75* 3.53*  HGB 10.8* 11.1* 10.4*  HCT 33.5* 34.1* 31.8*  MCV 90.8 90.9 90.1  MCH 29.3 29.6 29.5  MCHC 32.2 32.6 32.7  RDW 13.5 13.4 13.4  PLT 248 238 225    BNP Recent Labs  Lab 05/15/20 1516 05/17/20 0442  BNP 2,183.7* 1,001.2*     DDimer No results for input(s): DDIMER in the last 168 hours.   Radiology    ECHOCARDIOGRAM COMPLETE  Result Date: 05/17/2020    ECHOCARDIOGRAM REPORT   Patient Name:   MADISUN HARGROVE Maka Date of Exam: 05/17/2020 Medical Rec #:  [id number]     Height:       66.0 in Accession #:    [id number]    Weight:       164.1 lb Date of Birth:  Nov 17, 1932     BSA:  1.839 m Patient Age:    20 years      BP:           148/68 mmHg Patient Gender: F             HR:           76 bpm. Exam Location:  [facility] Procedure: 2D Echo, Color Doppler and Cardiac Doppler Indications:     I50.31 CHF-Acute Diastolic  History:         Patient has no prior history of Echocardiogram examinations.                  Risk Factors:Hypertension.  Sonographer:     Charmayne Sheer RDCS (AE) Referring Phys:  [id number] Collier Bullock Diagnosing Phys: Nelva Bush MD IMPRESSIONS  1. Left ventricular ejection fraction, by estimation, is 50 to 55%. The left ventricle has low normal function. Left ventricular endocardial border not optimally defined to evaluate regional wall motion. There is moderate left ventricular hypertrophy. Left ventricular diastolic parameters are consistent with Grade II diastolic dysfunction (pseudonormalization). Elevated left atrial pressure.  2. Right ventricular systolic function is normal. The right ventricular size is normal. Tricuspid regurgitation signal is inadequate for assessing PA pressure.  3. Left atrial size was moderately dilated.  4.  The pericardial effusion is posterior to the left ventricle.  5. The mitral valve is degenerative. Mild mitral valve regurgitation. No evidence of mitral stenosis. Severe mitral annular calcification.  6. The aortic valve is tricuspid. There is moderate calcification of the aortic valve. There is moderate thickening of the aortic valve. Aortic valve regurgitation is mild. Moderate to severe aortic valve stenosis. Aortic valve area, by VTI measures 0.67 cm. Aortic valve mean gradient measures 28.0 mmHg. Aortic valve Vmax measures 3.50 m/s.  7. The inferior vena cava is normal in size with greater than 50% respiratory variability, suggesting right atrial pressure of 3 mmHg. FINDINGS  Left Ventricle: Left ventricular ejection fraction, by estimation, is 50 to 55%. The left ventricle has low normal function. Left ventricular endocardial border not optimally defined to evaluate regional wall motion. The left ventricular internal cavity  size was normal in size. There is moderate left ventricular hypertrophy. Left ventricular diastolic parameters are consistent with Grade II diastolic dysfunction (pseudonormalization). Elevated left atrial pressure. Right Ventricle: The right ventricular size is normal. No increase in right ventricular wall thickness. Right ventricular systolic function is normal. Tricuspid regurgitation signal is inadequate for assessing PA pressure. Left Atrium: Left atrial size was moderately dilated. Right Atrium: Right atrial size was normal in size. Pericardium: Trivial pericardial effusion is present. The pericardial effusion is posterior to the left ventricle. Mitral Valve: The mitral valve is degenerative in appearance. There is mild thickening of the mitral valve leaflet(s). Severe mitral annular calcification. Mild mitral valve regurgitation. No evidence of mitral valve stenosis. MV peak gradient, 5.3 mmHg.  The mean mitral valve gradient is 3.0 mmHg. Tricuspid Valve: The tricuspid valve is  normal in structure. Tricuspid valve regurgitation is mild. Aortic Valve: The aortic valve is tricuspid. There is moderate calcification of the aortic valve. There is moderate thickening of the aortic valve. Aortic valve regurgitation is mild. Moderate to severe aortic stenosis is present. Aortic valve mean gradient measures 28.0 mmHg. Aortic valve peak gradient measures 49.0 mmHg. Aortic valve area, by VTI measures 0.67 cm. Pulmonic Valve: The pulmonic valve was normal in structure. Pulmonic valve regurgitation is trivial. No evidence of pulmonic stenosis. Aorta: The aortic root is normal in  size and structure. Pulmonary Artery: The pulmonary artery is of normal size. Venous: The inferior vena cava is normal in size with greater than 50% respiratory variability, suggesting right atrial pressure of 3 mmHg. IAS/Shunts: No atrial level shunt detected by color flow Doppler.  LEFT VENTRICLE PLAX 2D LVIDd:         3.60 cm  Diastology LVIDs:         3.20 cm  LV e' medial:    3.26 cm/s LV PW:         1.50 cm  LV E/e' medial:  34.7 LV IVS:        1.39 cm  LV e' lateral:   6.09 cm/s LVOT diam:     1.90 cm  LV E/e' lateral: 18.6 LV SV:         49 LV SV Index:   27 LVOT Area:     2.84 cm  RIGHT VENTRICLE RV Basal diam:  2.70 cm LEFT ATRIUM             Index       RIGHT ATRIUM           Index LA diam:        4.70 cm 2.56 cm/m  RA Area:     12.50 cm LA Vol (A2C):   72.7 ml 39.54 ml/m RA Volume:   27.60 ml  15.01 ml/m LA Vol (A4C):   97.0 ml 52.76 ml/m LA Biplane Vol: 86.8 ml 47.21 ml/m  AORTIC VALVE                    PULMONIC VALVE AV Area (Vmax):    0.72 cm     PV Vmax:       2.60 m/s AV Area (Vmean):   0.77 cm     PV Vmean:      191.000 cm/s AV Area (VTI):     0.67 cm     PV VTI:        0.546 m AV Vmax:           350.00 cm/s  PV Peak grad:  27.0 mmHg AV Vmean:          241.500 cm/s PV Mean grad:  16.0 mmHg AV VTI:            0.723 m AV Peak Grad:      49.0 mmHg AV Mean Grad:      28.0 mmHg LVOT Vmax:         88.80  cm/s LVOT Vmean:        65.200 cm/s LVOT VTI:          0.172 m LVOT/AV VTI ratio: 0.24  AORTA Ao Root diam: 3.00 cm MITRAL VALVE MV Area (PHT): 2.58 cm     SHUNTS MV Area VTI:   1.37 cm     Systemic VTI:  0.17 m MV Peak grad:  5.3 mmHg     Systemic Diam: 1.90 cm MV Mean grad:  3.0 mmHg MV Vmax:       1.15 m/s MV Vmean:      77.1 cm/s MV Decel Time: 294 msec MV E velocity: 113.00 cm/s MV A velocity: 99.00 cm/s MV E/A ratio:  1.14 Nelva Bush MD Electronically signed by Nelva Bush MD Signature Date/Time: 05/17/2020/10:48:29 AM    Final     Cardiac Studies    Echo 05/17/20   1. Left ventricular ejection fraction, by estimation, is 50 to 55%. The  left  ventricle has low normal function. Left ventricular endocardial  border not optimally defined to evaluate regional wall motion. There is  moderate left ventricular hypertrophy.  Left ventricular diastolic parameters are consistent with Grade II  diastolic dysfunction (pseudonormalization). Elevated left atrial  pressure.  2. Right ventricular systolic function is normal. The right ventricular  size is normal. Tricuspid regurgitation signal is inadequate for assessing  PA pressure.  3. Left atrial size was moderately dilated.  4. The pericardial effusion is posterior to the left ventricle.  5. The mitral valve is degenerative. Mild mitral valve regurgitation. No  evidence of mitral stenosis. Severe mitral annular calcification.  6. The aortic valve is tricuspid. There is moderate calcification of the  aortic valve. There is moderate thickening of the aortic valve. Aortic  valve regurgitation is mild. Moderate to severe aortic valve stenosis.  Aortic valve area, by VTI measures  0.67 cm. Aortic valve mean gradient measures 28.0 mmHg. Aortic valve Vmax  measures 3.50 m/s.  7. The inferior vena cava is normal in size with greater than 50%  respiratory variability, suggesting right atrial pressure of 3 mmHg.   Patient Profile      87 y.o. female with a hx of HTN, depression, OA, tobacco use who is being seen today for the evaluation of CHF  Assessment & Plan    Acute diastolic heart failure - presented with SOB, orthopnea, LLE. BNP elevated and CXR with possible volume overload.  - IV lasix 40mg  BID - Echo showed LVEF 50-55%, G2DD, mod LVH, mod dilated LA, pericardial effusion, mild MR, mild AR, mod to severe AS, mean gradient 42mmHg - BB initially held for acute exacerbation, recently started on metoprolol 12.5mg BID - Home losartan continued - UOP -1L. Weight down 172lbs>>163lbs, unsure if this is accurate.  - IV lasix held 4/5 - creatinine stable since yesterday - will likely need low dose maintenance dose  Mod to severe AS - mean gradient 47mmHg - murmur on exam - Might need further work-up, although can likely be done in OP setting. Unsure how aggressive patient wants to be, might just want conservative management, can be discussed in outpatient setting.  HTN - home losartan continued - BB started as above  Elevated troponin - patient did reports some left sided chest pain, sounded more atypical - Chest CT in 2018 showed aortic atherosclerosis and atherosclerosis of the coronary arteries - HS troponin elevated to 113, do not suspect acute ACS, suspect more demand ischemia given acute CHF. Can consider R/L heart cath as OP  Chronic anemia - stable  For questions or updates, please contact [place] Please consult [email or web address] for contact info under        Signed, Sherlon Nied Ninfa Meeker, PA-C  05/18/2020, 9:07 AM

## 2020-05-18 NOTE — Progress Notes (Signed)
Mobility Specialist - Progress Note   05/18/20 1400  Mobility  Activity Ambulated to bathroom  Level of Assistance Minimal assist, patient does 75% or more  Assistive Device Front wheel walker  Distance Ambulated (ft) 30 ft  Mobility Response Tolerated well  Mobility performed by Mobility specialist  $Mobility charge 1 Mobility    Pt ambulated to bathroom for BM. No LOB. RW in use. MinA for ambulation and peri-care.    Kathee Delton Mobility Specialist 05/18/20, 2:34 PM

## 2020-05-18 NOTE — Progress Notes (Signed)
Physical Therapy Treatment Patient Details Name: Paula Powell MRN: [id number] DOB: 20-Jun-1932 Today's Date: 05/18/2020    History of Present Illness 87 y.o. female with medical history significant for hypertension, depression, osteoarthritis who presents to the ER for evaluation of shortness of breath, orthopnea and bilateral lower extremity swelling.    PT Comments    Pt was long sitting in bed upon arriving. On 2 L o2 throughout session with sao2 >90%. Resting HR as low as 55 bpm however during ambulation 200 ft with RW elevated at peak to 88 bpm. BP prior to OOB activity 117/62. No symptoms of distress during session. Pt is fatigued afterwards but overall tolerated well. Pt will greatly benefit from rehab at DC to address deficits with strength, balance, and improve safety with ADLs. Acute PT will continue to follow and progress pt per current POC. She was sitting in recliner post session with call bell in reach, chair alarm in place, and RN tech in room.    Follow Up Recommendations  SNF;Supervision/Assistance - 24 hour     Equipment Recommendations  None recommended by PT    Recommendations for Other Services       Precautions / Restrictions Precautions Precautions: Fall Restrictions Weight Bearing Restrictions: No    Mobility  Bed Mobility Overal bed mobility: Needs Assistance Bed Mobility: Supine to Sit;Sit to Supine     Supine to sit: HOB elevated;Supervision Sit to supine: Supervision;HOB elevated   General bed mobility comments: Pt was easily able to exit bed without physical assistance. did have HOB elevated.    Transfers Overall transfer level: Needs assistance Equipment used: Rolling walker (2 wheeled) Transfers: Sit to/from Stand Sit to Stand: Min guard         General transfer comment: CGA from lowest bed height. required vcs for improved technique and sequencing  Ambulation/Gait Ambulation/Gait assistance: Min guard Gait Distance (Feet): 160  Feet Assistive device: Rolling walker (2 wheeled) Gait Pattern/deviations: Step-through pattern;Trunk flexed Gait velocity: slightly decreased   General Gait Details: poor standing/gait posture throughout. raised RW however pt still has flexed posture. HR remained < 88bpm throughout ambulation. at rest HR as low as 55bpm at times. pt ambulated on 2 L o2 with sao2 > 90% throughout     Balance Overall balance assessment: Needs assistance Sitting-balance support: Feet supported Sitting balance-Leahy Scale: Normal Sitting balance - Comments: no sitting balance deficits observed   Standing balance support: Bilateral upper extremity supported;During functional activity Standing balance-Leahy Scale: Fair Standing balance comment: is high fall risk         Cognition Arousal/Alertness: Awake/alert Behavior During Therapy: WFL for tasks assessed/performed Overall Cognitive Status: Within Functional Limits for tasks assessed      General Comments: Pt is A and O x 4 and very pleasant             Pertinent Vitals/Pain Pain Assessment: No/denies pain Pain Score: 0-No pain Faces Pain Scale: No hurt           PT Goals (current goals can now be found in the care plan section) Acute Rehab PT Goals Patient Stated Goal: to get better Progress towards PT goals: Progressing toward goals    Frequency    Min 2X/week      PT Plan Current plan remains appropriate       AM-PAC PT "6 Clicks" Mobility   Outcome Measure  Help needed turning from your back to your side while in a flat bed without using bedrails?: A Little Help  needed moving from lying on your back to sitting on the side of a flat bed without using bedrails?: A Little Help needed moving to and from a bed to a chair (including a wheelchair)?: A Little Help needed standing up from a chair using your arms (e.g., wheelchair or bedside chair)?: A Little Help needed to walk in hospital room?: A Little Help needed climbing  3-5 steps with a railing? : A Little 6 Click Score: 18    End of Session Equipment Utilized During Treatment: Gait belt;Oxygen Activity Tolerance: Patient limited by fatigue Patient left: in chair;with call bell/phone within reach;with chair alarm set;with nursing/sitter in room (RN tech in room) Nurse Communication: Mobility status PT Visit Diagnosis: Unsteadiness on feet (R26.81);Difficulty in walking, not elsewhere classified (R26.2)     Time: 6294-7654 PT Time Calculation (min) (ACUTE ONLY): 26 min  Charges:  $Gait Training: 8-22 mins $Therapeutic Activity: 8-22 mins                     Julaine Fusi PTA 05/18/20, 11:50 AM

## 2020-05-18 NOTE — TOC Initial Note (Signed)
Transition of Care [facility]) - Initial/Assessment Note    Patient Details  Name: Paula Powell MRN: [id number] Date of Birth: [birth year]  Transition of [facility]) CM/SW Contact:    Eileen Stanford, LCSW Phone Number: 05/18/2020, 12:20 PM  Clinical Narrative:   Pt states she is agreeable to SNF at d/c and pt prefers [facility]. Pt lives alone. Referral sent to [facility] for review.                Expected Discharge Plan: Skilled Nursing Facility Barriers to Discharge: Continued Medical Work up   Patient Goals and CMS Choice Patient states their goals for this hospitalization and ongoing recovery are:: to get better   Choice offered to / list presented to : Patient  Expected Discharge Plan and Services Expected Discharge Plan: [place] In-house Referral: Clinical Social Work   Post Acute Care Choice: [place] Living arrangements for the past 2 months: [place]                                      Prior Living Arrangements/Services Living arrangements for the past 2 months: Single Family Home Lives with:: Self Patient language and need for interpreter reviewed:: Yes        Need for Family Participation in Patient Care: Yes (Comment) Care giver support system in place?: Yes (comment)   Criminal Activity/Legal Involvement Pertinent to Current Situation/Hospitalization: No - Comment as needed  Activities of Daily Living Home Assistive Devices/Equipment: Dentures (specify type),Grab bars in shower (upper dentures) ADL Screening (condition at time of admission) Patient's cognitive ability adequate to safely complete daily activities?: Yes Is the patient deaf or have difficulty hearing?: No Does the patient have difficulty seeing, even when wearing glasses/contacts?: No Does the patient have difficulty concentrating, remembering, or making decisions?: Yes Patient able to express need for assistance with ADLs?: No Does the  patient have difficulty dressing or bathing?: No Independently performs ADLs?: Yes (appropriate for developmental age) Does the patient have difficulty walking or climbing stairs?: Yes Weakness of Legs: Both Weakness of Arms/Hands: Both  Permission Sought/Granted Permission sought to share information with : Family Supports Permission granted to share information with : Yes, Verbal Permission Granted  Share Information with NAME: rudy  Permission granted to share info w AGENCY: [facility]  Permission granted to share info w Relationship: granddaughter     Emotional Assessment Appearance:: Appears stated age Attitude/Demeanor/Rapport: Engaged Affect (typically observed): Accepting,Appropriate Orientation: : Oriented to Self,Oriented to Place,Oriented to  Time,Oriented to Situation Alcohol / Substance Use: Not Applicable Psych Involvement: No (comment)  Admission diagnosis:  Dysuria [R30.0] Acute CHF (congestive heart failure) (HCC) [I50.9] Demand ischemia of myocardium (HCC) [I24.8] Hypervolemia, unspecified hypervolemia type [E87.70] Patient Active Problem List   Diagnosis Date Noted  . Acute CHF (congestive heart failure) ([place]) 05/15/2020  . Nicotine dependence 05/15/2020  . Anemia of chronic disease 05/15/2020  . Elevated troponin 05/15/2020  . Depressive disorder   . Essential hypertension   . Primary osteoarthritis of right knee 08/21/2016   PCP:  Derinda Late, MD Pharmacy:   [facility] [address] [place] [address] [place] [address] Phone: [phone] Fax: [phone]     Social Determinants of Health (SDOH) Interventions    Readmission Risk Interventions No flowsheet data found.

## 2020-05-18 NOTE — Care Management Important Message (Signed)
Important Message  Patient Details  Name: Paula Powell MRN: [id number] Date of Birth: 04/11/1932   Medicare Important Message Given:  Yes     Dannette Barbara 05/18/2020, 11:24 AM

## 2020-05-18 NOTE — TOC Progression Note (Signed)
Transition of Care [facility]) - Progression Note    Patient Details  Name: Paula Powell MRN: [id number] Date of Birth: Oct 09, 1932  Transition of Care [facility]) CM/SW Contact  Eileen Stanford, LCSW Phone Number: 05/18/2020, 1:40 PM  Clinical Narrative:  Spoke with Tammy with [facility]--auth started for admit to [facility] 4/7, Josem Kaufmann also started for [facility].     Expected Discharge Plan: [place] Barriers to Discharge: Continued Medical Work up  Expected Discharge Plan and Services Expected Discharge Plan: [place] In-house Referral: Clinical Social Work   Post Acute Care Choice: [place] Living arrangements for the past 2 months: Single Family Home                                       Social Determinants of Health (SDOH) Interventions    Readmission Risk Interventions No flowsheet data found.

## 2020-05-18 NOTE — Progress Notes (Signed)
[place] at [name] NAME: Paula Powell    MR#:  [id number]  DATE OF BIRTH:  May 16, 1932  SUBJECTIVE:  patient feels a lot better. She does not want to get cardiac cath done. She tells me her family is aware of her decision. She also tells me she wants to be DNR when code status was discussed with her. Denies chest pain. Sats are stable on room air. Eating well  REVIEW OF SYSTEMS:   ROS Tolerating Diet: Tolerating PT:   DRUG ALLERGIES:   Allergies  Allergen Reactions  . Ace Inhibitors Cough  . Ciprofloxacin Nausea Only    VITALS:  Blood pressure (!) 114/58, pulse 62, temperature 98 F (36.7 C), temperature source Oral, resp. rate 18, height 5\' 6"  (1.676 m), weight 74.3 kg, SpO2 98 %.  PHYSICAL EXAMINATION:   Physical Exam  GENERAL:  87 y.o.-year-old patient lying in the bed with no acute distress.  LUNGS: Normal breath sounds bilaterally, no wheezing, rales, rhonchi. No use of accessory muscles of respiration.  CARDIOVASCULAR: S1, S2 XNTZGY.1/7 systolic murmurs, no rubs, or gallops.  ABDOMEN: Soft, nontender, nondistended. Bowel sounds present. No organomegaly or mass.  EXTREMITIES: No cyanosis, clubbing or edema b/l.    NEUROLOGIC: Cranial nerves II through XII are intact. No focal Motor or sensory deficits b/l.   PSYCHIATRIC:  patient is alert and oriented x 3.  SKIN: No obvious rash, lesion, or ulcer.   LABORATORY PANEL:  CBC Recent Labs  Lab 05/17/20 0442  WBC 5.0  HGB 10.4*  HCT 31.8*  PLT 225    Chemistries  Recent Labs  Lab 05/18/20 0441  NA 137  K 3.9  CL 103  CO2 25  GLUCOSE 91  BUN 28*  CREATININE 1.66*  CALCIUM 9.2   Cardiac Enzymes No results for input(s): TROPONINI in the last 168 hours. RADIOLOGY:  ECHOCARDIOGRAM COMPLETE  Result Date: 05/17/2020    ECHOCARDIOGRAM REPORT   Patient Name:   Paula Powell Brogan Date of Exam: 05/17/2020 Medical Rec #:  [id number]     Height:       66.0 in Accession #:     [id number]    Weight:       164.1 lb Date of Birth:  1932/08/11     BSA:          1.839 m Patient Age:    87 years      BP:           148/68 mmHg Patient Gender: F             HR:           76 bpm. Exam Location:  [facility] Procedure: 2D Echo, Color Doppler and Cardiac Doppler Indications:     I50.31 CHF-Acute Diastolic  History:         Patient has no prior history of Echocardiogram examinations.                  Risk Factors:Hypertension.  Sonographer:     Charmayne Sheer RDCS (AE) Referring Phys:  [id number] Collier Bullock Diagnosing Phys: Nelva Bush MD IMPRESSIONS  1. Left ventricular ejection fraction, by estimation, is 50 to 55%. The left ventricle has low normal function. Left ventricular endocardial border not optimally defined to evaluate regional wall motion. There is moderate left ventricular hypertrophy. Left ventricular diastolic parameters are consistent with Grade II diastolic dysfunction (pseudonormalization). Elevated left atrial pressure.  2. Right ventricular systolic function is  normal. The right ventricular size is normal. Tricuspid regurgitation signal is inadequate for assessing PA pressure.  3. Left atrial size was moderately dilated.  4. The pericardial effusion is posterior to the left ventricle.  5. The mitral valve is degenerative. Mild mitral valve regurgitation. No evidence of mitral stenosis. Severe mitral annular calcification.  6. The aortic valve is tricuspid. There is moderate calcification of the aortic valve. There is moderate thickening of the aortic valve. Aortic valve regurgitation is mild. Moderate to severe aortic valve stenosis. Aortic valve area, by VTI measures 0.67 cm. Aortic valve mean gradient measures 28.0 mmHg. Aortic valve Vmax measures 3.50 m/s.  7. The inferior vena cava is normal in size with greater than 50% respiratory variability, suggesting right atrial pressure of 3 mmHg. FINDINGS  Left Ventricle: Left ventricular ejection fraction, by estimation, is 50 to 55%. The  left ventricle has low normal function. Left ventricular endocardial border not optimally defined to evaluate regional wall motion. The left ventricular internal cavity  size was normal in size. There is moderate left ventricular hypertrophy. Left ventricular diastolic parameters are consistent with Grade II diastolic dysfunction (pseudonormalization). Elevated left atrial pressure. Right Ventricle: The right ventricular size is normal. No increase in right ventricular wall thickness. Right ventricular systolic function is normal. Tricuspid regurgitation signal is inadequate for assessing PA pressure. Left Atrium: Left atrial size was moderately dilated. Right Atrium: Right atrial size was normal in size. Pericardium: Trivial pericardial effusion is present. The pericardial effusion is posterior to the left ventricle. Mitral Valve: The mitral valve is degenerative in appearance. There is mild thickening of the mitral valve leaflet(s). Severe mitral annular calcification. Mild mitral valve regurgitation. No evidence of mitral valve stenosis. MV peak gradient, 5.3 mmHg.  The mean mitral valve gradient is 3.0 mmHg. Tricuspid Valve: The tricuspid valve is normal in structure. Tricuspid valve regurgitation is mild. Aortic Valve: The aortic valve is tricuspid. There is moderate calcification of the aortic valve. There is moderate thickening of the aortic valve. Aortic valve regurgitation is mild. Moderate to severe aortic stenosis is present. Aortic valve mean gradient measures 28.0 mmHg. Aortic valve peak gradient measures 49.0 mmHg. Aortic valve area, by VTI measures 0.67 cm. Pulmonic Valve: The pulmonic valve was normal in structure. Pulmonic valve regurgitation is trivial. No evidence of pulmonic stenosis. Aorta: The aortic root is normal in size and structure. Pulmonary Artery: The pulmonary artery is of normal size. Venous: The inferior vena cava is normal in size with greater than 50% respiratory variability,  suggesting right atrial pressure of 3 mmHg. IAS/Shunts: No atrial level shunt detected by color flow Doppler.  LEFT VENTRICLE PLAX 2D LVIDd:         3.60 cm  Diastology LVIDs:         3.20 cm  LV e' medial:    3.26 cm/s LV PW:         1.50 cm  LV E/e' medial:  34.7 LV IVS:        1.39 cm  LV e' lateral:   6.09 cm/s LVOT diam:     1.90 cm  LV E/e' lateral: 18.6 LV SV:         49 LV SV Index:   27 LVOT Area:     2.84 cm  RIGHT VENTRICLE RV Basal diam:  2.70 cm LEFT ATRIUM             Index       RIGHT ATRIUM  Index LA diam:        4.70 cm 2.56 cm/m  RA Area:     12.50 cm LA Vol (A2C):   72.7 ml 39.54 ml/m RA Volume:   27.60 ml  15.01 ml/m LA Vol (A4C):   97.0 ml 52.76 ml/m LA Biplane Vol: 86.8 ml 47.21 ml/m  AORTIC VALVE                    PULMONIC VALVE AV Area (Vmax):    0.72 cm     PV Vmax:       2.60 m/s AV Area (Vmean):   0.77 cm     PV Vmean:      191.000 cm/s AV Area (VTI):     0.67 cm     PV VTI:        0.546 m AV Vmax:           350.00 cm/s  PV Peak grad:  27.0 mmHg AV Vmean:          241.500 cm/s PV Mean grad:  16.0 mmHg AV VTI:            0.723 m AV Peak Grad:      49.0 mmHg AV Mean Grad:      28.0 mmHg LVOT Vmax:         88.80 cm/s LVOT Vmean:        65.200 cm/s LVOT VTI:          0.172 m LVOT/AV VTI ratio: 0.24  AORTA Ao Root diam: 3.00 cm MITRAL VALVE MV Area (PHT): 2.58 cm     SHUNTS MV Area VTI:   1.37 cm     Systemic VTI:  0.17 m MV Peak grad:  5.3 mmHg     Systemic Diam: 1.90 cm MV Mean grad:  3.0 mmHg MV Vmax:       1.15 m/s MV Vmean:      77.1 cm/s MV Decel Time: 294 msec MV E velocity: 113.00 cm/s MV A velocity: 99.00 cm/s MV E/A ratio:  1.14 Harrell Gave End MD Electronically signed by Nelva Bush MD Signature Date/Time: 05/17/2020/10:48:29 AM    Final    ASSESSMENT AND PLAN:  Paula Chinchilla Pettyis a 87 y.o.femalewith medical history significant forhypertension, depression, osteoarthritis who presents to the ER for evaluation of shortness of breath for about 4 days.  Patient noticed shortness of breath with exertion that has worsened to shortness of breath at rest. She has some mild lower extremity swelling and has also noted that she has to sit up in bed or use multiple pillows to keep her head elevated while she sleeps  Acute diastolic HF with moderate to severe AS Diastolic grade II dysfunction --Ef 50-55% -low nml., Mod-sev. AS --BNP elevated --CHMGCardiology consult appreciated--pt diuresed and now euvolemic. Hold further lasix dosing per card recs -- patient is refusing cardiac cath.  HTN: continue losartan, beta-blocker at low dose   AS-mod-severe on echo Will need outpatient monitoring with cardiology  Anemia of Chronic Disease -- likely secondary to CKD. H&H are stable   Acute on  CKD IIIA: baseline Cr 1.1 /GFR of 47-53 from [facility] labs --creat 1.6 due to diuresis -holding lasix  --Will continue to monitor   Nicotine dependence: smoking cessation counseling   Patient overall improving hemodynamically stable. Will continue to monitor. Physical therapy recommends rehab. Patient has a bed at [facility]. Insurance authorization pending. Will discharge to rehab once auto obtain   DVT prophylaxis: lovenox  Code Status: DNR--d/w pt and grand dter agreeable Family Communication:  granddaughter [name] Disposition Plan:  rehab  Level of care: Progressive Cardiac   Status is: Inpatient   Dispo: The patient is from: Home  Anticipated d/c is to: SNF once auth obtained  Patient currently is medically best optimized              Difficult to place patient: NO  Level of care: Progressive Cardiac        TOTAL TIME TAKING CARE OF THIS PATIENT:25** minutes.  >50% time spent on counselling and coordination of care  Note: This dictation was prepared with Dragon dictation along with smaller phrase technology. Any transcriptional errors that result from this process are unintentional.  Fritzi Mandes  M.D    [facility]   CC: Primary care physician; Derinda Late, MDPatient ID: Paula Powell, female   DOB: Jul 28, 1932, 87 y.o.   MRN: [id number]

## 2020-05-18 NOTE — TOC Progression Note (Signed)
Transition of Care [facility]) - Progression Note    Patient Details  Name: Paula Powell MRN: [id number] Date of Birth: 06-07-1932  Transition of Care [facility]) CM/SW Contact  Beverly Sessions, RN Phone Number: 05/18/2020, 12:21 PM  Clinical Narrative:     Patient confirms she would like to go to [facility] at discharge.  Accepted bed in [place] and notified Magda Paganini at [place] [employer] left for [employer] to start auth  Patient has not been vaccinated for covid  Expected Discharge Plan: [place] Barriers to Discharge: Continued Medical Work up  Expected Discharge Plan and Services Expected Discharge Plan: [place] In-house Referral: Clinical Social Work   Post Acute Care Choice: [place] Living arrangements for the past 2 months: Single Family Home                                       Social Determinants of Health (SDOH) Interventions    Readmission Risk Interventions No flowsheet data found.

## 2020-05-18 NOTE — Progress Notes (Signed)
Patient was taken off oxygen. Oxygen saturation remains greater than 93% on room air.

## 2020-05-19 ENCOUNTER — Telehealth: Payer: Self-pay | Admitting: Cardiovascular Disease

## 2020-05-19 DIAGNOSIS — I10 Essential (primary) hypertension: Secondary | ICD-10-CM | POA: Diagnosis not present

## 2020-05-19 DIAGNOSIS — M1990 Unspecified osteoarthritis, unspecified site: Secondary | ICD-10-CM | POA: Diagnosis not present

## 2020-05-19 DIAGNOSIS — R300 Dysuria: Secondary | ICD-10-CM | POA: Diagnosis not present

## 2020-05-19 DIAGNOSIS — J309 Allergic rhinitis, unspecified: Secondary | ICD-10-CM | POA: Diagnosis not present

## 2020-05-19 DIAGNOSIS — M62838 Other muscle spasm: Secondary | ICD-10-CM | POA: Diagnosis not present

## 2020-05-19 DIAGNOSIS — I248 Other forms of acute ischemic heart disease: Secondary | ICD-10-CM | POA: Diagnosis not present

## 2020-05-19 DIAGNOSIS — N1831 Chronic kidney disease, stage 3a: Secondary | ICD-10-CM | POA: Diagnosis not present

## 2020-05-19 DIAGNOSIS — I130 Hypertensive heart and chronic kidney disease with heart failure and stage 1 through stage 4 chronic kidney disease, or unspecified chronic kidney disease: Secondary | ICD-10-CM | POA: Diagnosis not present

## 2020-05-19 DIAGNOSIS — K589 Irritable bowel syndrome without diarrhea: Secondary | ICD-10-CM | POA: Diagnosis not present

## 2020-05-19 DIAGNOSIS — L309 Dermatitis, unspecified: Secondary | ICD-10-CM | POA: Diagnosis not present

## 2020-05-19 DIAGNOSIS — I5031 Acute diastolic (congestive) heart failure: Secondary | ICD-10-CM | POA: Diagnosis not present

## 2020-05-19 DIAGNOSIS — F3341 Major depressive disorder, recurrent, in partial remission: Secondary | ICD-10-CM | POA: Diagnosis not present

## 2020-05-19 DIAGNOSIS — Z96651 Presence of right artificial knee joint: Secondary | ICD-10-CM | POA: Diagnosis not present

## 2020-05-19 DIAGNOSIS — E8770 Fluid overload, unspecified: Secondary | ICD-10-CM | POA: Diagnosis not present

## 2020-05-19 DIAGNOSIS — F331 Major depressive disorder, recurrent, moderate: Secondary | ICD-10-CM | POA: Diagnosis not present

## 2020-05-19 DIAGNOSIS — F17200 Nicotine dependence, unspecified, uncomplicated: Secondary | ICD-10-CM | POA: Diagnosis not present

## 2020-05-19 DIAGNOSIS — G5600 Carpal tunnel syndrome, unspecified upper limb: Secondary | ICD-10-CM | POA: Diagnosis not present

## 2020-05-19 DIAGNOSIS — R778 Other specified abnormalities of plasma proteins: Secondary | ICD-10-CM | POA: Diagnosis not present

## 2020-05-19 DIAGNOSIS — I5032 Chronic diastolic (congestive) heart failure: Secondary | ICD-10-CM | POA: Diagnosis not present

## 2020-05-19 DIAGNOSIS — K5790 Diverticulosis of intestine, part unspecified, without perforation or abscess without bleeding: Secondary | ICD-10-CM | POA: Diagnosis not present

## 2020-05-19 DIAGNOSIS — D631 Anemia in chronic kidney disease: Secondary | ICD-10-CM | POA: Diagnosis not present

## 2020-05-19 DIAGNOSIS — I350 Nonrheumatic aortic (valve) stenosis: Secondary | ICD-10-CM | POA: Diagnosis not present

## 2020-05-19 DIAGNOSIS — F17209 Nicotine dependence, unspecified, with unspecified nicotine-induced disorders: Secondary | ICD-10-CM | POA: Diagnosis not present

## 2020-05-19 DIAGNOSIS — D638 Anemia in other chronic diseases classified elsewhere: Secondary | ICD-10-CM | POA: Diagnosis not present

## 2020-05-19 DIAGNOSIS — E785 Hyperlipidemia, unspecified: Secondary | ICD-10-CM | POA: Diagnosis not present

## 2020-05-19 MED ORDER — METOPROLOL TARTRATE 25 MG PO TABS: 12.5000 mg | ORAL_TABLET | Freq: Two times a day (BID) | ORAL | 0 refills | Status: AC

## 2020-05-19 MED ORDER — FUROSEMIDE 20 MG PO TABS: 20.0000 mg | ORAL_TABLET | Freq: Every day | ORAL | 2 refills | Status: AC

## 2020-05-19 NOTE — Discharge Summary (Signed)
[facility] [place] NAME: Paula Powell    MR#:  [id number]  DATE OF BIRTH:  March 27, 1932  DATE OF ADMISSION:  05/15/2020 ADMITTING PHYSICIAN: Collier Bullock, MD  DATE OF DISCHARGE: 05/19/2020  PRIMARY CARE PHYSICIAN: Derinda Late, MD    ADMISSION DIAGNOSIS:  Dysuria [R30.0] Acute CHF (congestive heart failure) (HCC) [I50.9] Demand ischemia of myocardium (HCC) [I24.8] Hypervolemia, unspecified hypervolemia type [E87.70]  DISCHARGE DIAGNOSIS:  acute diastolic heart failure moderate to severe aortic stenosis acute on chronic CKD stage IIIa  SECONDARY DIAGNOSIS:   Past Medical History:  Diagnosis Date  . Allergic eczema   . Anemia   . Carpal tunnel syndrome   . Chest pain, atypical   . Depression   . Depressive disorder   . Diverticulosis   . DJD (degenerative joint disease)   . Eczema   . Essential hypertension   . H/O: hysterectomy   . Hives   . IBD (inflammatory bowel disease)   . Irritable bowel syndrome   . PONV (postoperative nausea and vomiting)    left knee arthroscopy  . Rhinitis, allergic   . Right carotid bruit     HOSPITAL COURSE:   Delita T Pettyis a 87 y.o.femalewith medical history significant forhypertension, depression, osteoarthritis who presents to the ER for evaluation of shortness of breath for about 4 days. Patient noticed shortness of breath with exertion that has worsened to shortness of breath at rest. She has some mild lower extremity swelling and has also noted that she has to sit up in bed or use multiple pillows to keep her head elevated while she sleeps  Acutediastolic HF with moderate to severe AS Diastolic grade II dysfunction --Ef 50-55% -low nml., Mod-sev. AS --BNP elevated --CHMGCardiology consult appreciated--pt diuresed and now euvolemic. Hold IV further lasix dosing per card recs. Start po lasix 20 mg qd -- patient is refusing cardiac cath.  OXB:DZHGDJME losartan,  beta-blocker at low dose   AS-mod-severe on echo Will need outpatient monitoring with [facility] cardiology  Anemia of Chronic Disease -- likely secondary to CKD. H&H are stable   Acute on  CKD IIIA: baseline Cr 1.1 /GFR of 47-53 from [facility] labs --creat 1.6 due to diuresis -holding lasix    Patient overall improving hemodynamically stable. Will continue to monitor. Physical therapy recommends rehab. Patient has a bed at [facility]. Insurance authorization pending. Will discharge to rehab once auth obtained likely today   DVT prophylaxis:lovenox  Code Status:DNR--d/w pt and grand dter [name] agreeable Family Communication: granddaughter [name] Disposition Plan: rehab  Level of care:Progressive Cardiac  Status is: Inpatient   Dispo: The patient is from: Home Anticipated d/c is to: SNF once auth obtained Patient currently is medically best optimized Difficult to place patient: NO  CONSULTS OBTAINED:  Treatment Team:  Nelva Bush, MD  DRUG ALLERGIES:   Allergies  Allergen Reactions  . Ace Inhibitors Cough  . Ciprofloxacin Nausea Only    DISCHARGE MEDICATIONS:   Allergies as of 05/19/2020      Reactions   Ace Inhibitors Cough   Ciprofloxacin Nausea Only      Medication List    STOP taking these medications   citalopram 20 MG tablet Commonly known as: CELEXA   enoxaparin 40 MG/0.4ML injection Commonly known as: Lovenox   hydrochlorothiazide 25 MG tablet Commonly known as: HYDRODIURIL   oxyCODONE 5 MG immediate release tablet Commonly known as: Oxy IR/ROXICODONE   sulfamethoxazole-trimethoprim 800-160 MG tablet Commonly known as: BACTRIM DS  traMADol 50 MG tablet Commonly known as: Ultram     TAKE these medications   acetaminophen 500 MG tablet Commonly known as: TYLENOL Take 1,000 mg by mouth every 6 (six) hours as needed for mild pain.   clobetasol ointment 0.05 % Commonly known as:  TEMOVATE Apply topically 2 (two) times daily.   cyclobenzaprine 10 MG tablet Commonly known as: FLEXERIL Take 10 mg by mouth 3 (three) times daily as needed for muscle spasms.   furosemide 20 MG tablet Commonly known as: Lasix Take 1 tablet (20 mg total) by mouth daily. Start taking on: May 20, 2020   losartan 100 MG tablet Commonly known as: COZAAR Take 100 mg by mouth daily. In am.   metoprolol tartrate 25 MG tablet Commonly known as: LOPRESSOR Take 0.5 tablets (12.5 mg total) by mouth 2 (two) times daily.       If you experience worsening of your admission symptoms, develop shortness of breath, life threatening emergency, suicidal or homicidal thoughts you must seek medical attention immediately by calling 911 or calling your MD immediately  if symptoms less severe.  You Must read complete instructions/literature along with all the possible adverse reactions/side effects for all the Medicines you take and that have been prescribed to you. Take any new Medicines after you have completely understood and accept all the possible adverse reactions/side effects.   Please note  You were cared for by a hospitalist during your hospital stay. If you have any questions about your discharge medications or the care you received while you were in the hospital after you are discharged, you can call the unit and asked to speak with the hospitalist on call if the hospitalist that took care of you is not available. Once you are discharged, your primary care physician will handle any further medical issues. Please note that NO REFILLS for any discharge medications will be authorized once you are discharged, as it is imperative that you return to your primary care physician (or establish a relationship with a primary care physician if you do not have one) for your aftercare needs so that they can reassess your need for medications and monitor your lab values. Today   SUBJECTIVE   No n ew  complaints  VITAL SIGNS:  Blood pressure 125/69, pulse (!) 59, temperature 97.6 F (36.4 C), temperature source Oral, resp. rate 15, height 5\' 6"  (1.676 m), weight 72.3 kg, SpO2 95 %.  I/O:    Intake/Output Summary (Last 24 hours) at 05/19/2020 0801 Last data filed at 05/18/2020 1553 Gross per 24 hour  Intake 360 ml  Output 800 ml  Net -440 ml    PHYSICAL EXAMINATION:  GENERAL:  87 y.o.-year-old patient lying in the bed with no acute distress.  LUNGS: Normal breath sounds bilaterally, no wheezing, rales,rhonchi or crepitation. No use of accessory muscles of respiration.  CARDIOVASCULAR: S1, S2 normal. 3/6 systolic murmurs, no rubs, or gallops.  ABDOMEN: Soft, non-tender, non-distended. Bowel sounds present. No organomegaly or mass.  EXTREMITIES: No pedal edema, cyanosis, or clubbing.  NEUROLOGIC: Cranial nerves II through XII are intact. Muscle strength 5/5 in all extremities. Sensation intact. Gait not checked.  PSYCHIATRIC: The patient is alert and oriented x 3.  SKIN: No obvious rash, lesion, or ulcer.   DATA REVIEW:   CBC  Recent Labs  Lab 05/17/20 0442  WBC 5.0  HGB 10.4*  HCT 31.8*  PLT 225    Chemistries  Recent Labs  Lab 05/18/20 0441  NA 137  K 3.9  CL 103  CO2 25  GLUCOSE 91  BUN 28*  CREATININE 1.66*  CALCIUM 9.2    Microbiology Results   Recent Results (from the past 240 hour(s))  Resp Panel by RT-PCR (Flu A&B, Covid) Nasopharyngeal Swab     Status: None   Collection Time: 05/15/20  3:16 PM   Specimen: Nasopharyngeal Swab; Nasopharyngeal(NP) swabs in vial transport medium  Result Value Ref Range Status   SARS Coronavirus 2 by RT PCR NEGATIVE NEGATIVE Final    Comment: (NOTE) SARS-CoV-2 target nucleic acids are NOT DETECTED.  The SARS-CoV-2 RNA is generally detectable in upper respiratory specimens during the acute phase of infection. The lowest concentration of SARS-CoV-2 viral copies this assay can detect is 138 copies/mL. A negative result  does not preclude SARS-Cov-2 infection and should not be used as the sole basis for treatment or other patient management decisions. A negative result may occur with  improper specimen collection/handling, submission of specimen other than nasopharyngeal swab, presence of viral mutation(s) within the areas targeted by this assay, and inadequate number of viral copies(<138 copies/mL). A negative result must be combined with clinical observations, patient history, and epidemiological information. The expected result is Negative.  Fact Sheet for Patients:  EntrepreneurPulse.com.au  Fact Sheet for Healthcare Providers:  IncredibleEmployment.be  This test is no t yet approved or cleared by the [place] FDA and  has been authorized for detection and/or diagnosis of SARS-CoV-2 by FDA under an Emergency Use Authorization (EUA). This EUA will remain  in effect (meaning this test can be used) for the duration of the COVID-19 declaration under Section 564(b)(1) of the Act, 21 U.S.C.section 360bbb-3(b)(1), unless the authorization is terminated  or revoked sooner.       Influenza A by PCR NEGATIVE NEGATIVE Final   Influenza B by PCR NEGATIVE NEGATIVE Final    Comment: (NOTE) The Xpert Xpress SARS-CoV-2/FLU/RSV plus assay is intended as an aid in the diagnosis of influenza from Nasopharyngeal swab specimens and should not be used as a sole basis for treatment. Nasal washings and aspirates are unacceptable for Xpert Xpress SARS-CoV-2/FLU/RSV testing.  Fact Sheet for Patients: EntrepreneurPulse.com.au  Fact Sheet for Healthcare Providers: IncredibleEmployment.be  This test is not yet approved or cleared by the [place] FDA and has been authorized for detection and/or diagnosis of SARS-CoV-2 by FDA under an Emergency Use Authorization (EUA). This EUA will remain in effect (meaning this test can be used) for  the duration of the COVID-19 declaration under Section 564(b)(1) of the Act, 21 U.S.C. section 360bbb-3(b)(1), unless the authorization is terminated or revoked.  Performed at [facility], [address], [place], [place] [address]   Urine Culture     Status: None   Collection Time: 05/15/20  7:22 PM   Specimen: Urine, Random  Result Value Ref Range Status   Specimen Description   Final    URINE, RANDOM Performed at [facility], [address], [place], [place] [address]    Special Requests   Final    NONE Performed at [facility], [address], [place], [place] [address]    Culture   Final    NO GROWTH Performed at [facility], [place] [address] [place], [place] [address]    Report Status 05/17/2020 FINAL  Final  Resp Panel by RT-PCR (Flu A&B, Covid) Nasopharyngeal Swab     Status: None   Collection Time: 05/18/20  3:30 PM   Specimen: Nasopharyngeal Swab; Nasopharyngeal(NP)  swabs in vial transport medium  Result Value Ref Range Status   SARS Coronavirus 2 by RT PCR NEGATIVE NEGATIVE Final    Comment: (NOTE) SARS-CoV-2 target nucleic acids are NOT DETECTED.  The SARS-CoV-2 RNA is generally detectable in upper respiratory specimens during the acute phase of infection. The lowest concentration of SARS-CoV-2 viral copies this assay can detect is 138 copies/mL. A negative result does not preclude SARS-Cov-2 infection and should not be used as the sole basis for treatment or other patient management decisions. A negative result may occur with  improper specimen collection/handling, submission of specimen other than nasopharyngeal swab, presence of viral mutation(s) within the areas targeted by this assay, and inadequate number of viral copies(<138 copies/mL). A negative result must be combined with clinical observations, patient history, and epidemiological information. The expected result is Negative.  Fact Sheet for Patients:   EntrepreneurPulse.com.au  Fact Sheet for Healthcare Providers:  IncredibleEmployment.be  This test is no t yet approved or cleared by the [place] FDA and  has been authorized for detection and/or diagnosis of SARS-CoV-2 by FDA under an Emergency Use Authorization (EUA). This EUA will remain  in effect (meaning this test can be used) for the duration of the COVID-19 declaration under Section 564(b)(1) of the Act, 21 U.S.C.section 360bbb-3(b)(1), unless the authorization is terminated  or revoked sooner.       Influenza A by PCR NEGATIVE NEGATIVE Final   Influenza B by PCR NEGATIVE NEGATIVE Final    Comment: (NOTE) The Xpert Xpress SARS-CoV-2/FLU/RSV plus assay is intended as an aid in the diagnosis of influenza from Nasopharyngeal swab specimens and should not be used as a sole basis for treatment. Nasal washings and aspirates are unacceptable for Xpert Xpress SARS-CoV-2/FLU/RSV testing.  Fact Sheet for Patients: EntrepreneurPulse.com.au  Fact Sheet for Healthcare Providers: IncredibleEmployment.be  This test is not yet approved or cleared by the [place] FDA and has been authorized for detection and/or diagnosis of SARS-CoV-2 by FDA under an Emergency Use Authorization (EUA). This EUA will remain in effect (meaning this test can be used) for the duration of the COVID-19 declaration under Section 564(b)(1) of the Act, 21 U.S.C. section 360bbb-3(b)(1), unless the authorization is terminated or revoked.  Performed at [facility], [address] [place], [place] [address]     RADIOLOGY:  ECHOCARDIOGRAM COMPLETE  Result Date: 05/17/2020    ECHOCARDIOGRAM REPORT   Patient Name:   ELSI STELZER Loose Date of Exam: 05/17/2020 Medical Rec #:  [id number]     Height:       66.0 in Accession #:    [id number]    Weight:       164.1 lb Date of Birth:  20-Jun-1932     BSA:          1.839 m Patient Age:     87 years      BP:           148/68 mmHg Patient Gender: F             HR:           76 bpm. Exam Location:  [facility] Procedure: 2D Echo, Color Doppler and Cardiac Doppler Indications:     I50.31 CHF-Acute Diastolic  History:         Patient has no prior history of Echocardiogram examinations.                  Risk Factors:Hypertension.  Sonographer:     Charmayne Sheer  RDCS (AE) Referring Phys:  [id number] UMPNTIRW AGBATA Diagnosing Phys: Harrell Gave End MD IMPRESSIONS  1. Left ventricular ejection fraction, by estimation, is 50 to 55%. The left ventricle has low normal function. Left ventricular endocardial border not optimally defined to evaluate regional wall motion. There is moderate left ventricular hypertrophy. Left ventricular diastolic parameters are consistent with Grade II diastolic dysfunction (pseudonormalization). Elevated left atrial pressure.  2. Right ventricular systolic function is normal. The right ventricular size is normal. Tricuspid regurgitation signal is inadequate for assessing PA pressure.  3. Left atrial size was moderately dilated.  4. The pericardial effusion is posterior to the left ventricle.  5. The mitral valve is degenerative. Mild mitral valve regurgitation. No evidence of mitral stenosis. Severe mitral annular calcification.  6. The aortic valve is tricuspid. There is moderate calcification of the aortic valve. There is moderate thickening of the aortic valve. Aortic valve regurgitation is mild. Moderate to severe aortic valve stenosis. Aortic valve area, by VTI measures 0.67 cm. Aortic valve mean gradient measures 28.0 mmHg. Aortic valve Vmax measures 3.50 m/s.  7. The inferior vena cava is normal in size with greater than 50% respiratory variability, suggesting right atrial pressure of 3 mmHg. FINDINGS  Left Ventricle: Left ventricular ejection fraction, by estimation, is 50 to 55%. The left ventricle has low normal function. Left ventricular endocardial border not optimally defined to  evaluate regional wall motion. The left ventricular internal cavity  size was normal in size. There is moderate left ventricular hypertrophy. Left ventricular diastolic parameters are consistent with Grade II diastolic dysfunction (pseudonormalization). Elevated left atrial pressure. Right Ventricle: The right ventricular size is normal. No increase in right ventricular wall thickness. Right ventricular systolic function is normal. Tricuspid regurgitation signal is inadequate for assessing PA pressure. Left Atrium: Left atrial size was moderately dilated. Right Atrium: Right atrial size was normal in size. Pericardium: Trivial pericardial effusion is present. The pericardial effusion is posterior to the left ventricle. Mitral Valve: The mitral valve is degenerative in appearance. There is mild thickening of the mitral valve leaflet(s). Severe mitral annular calcification. Mild mitral valve regurgitation. No evidence of mitral valve stenosis. MV peak gradient, 5.3 mmHg.  The mean mitral valve gradient is 3.0 mmHg. Tricuspid Valve: The tricuspid valve is normal in structure. Tricuspid valve regurgitation is mild. Aortic Valve: The aortic valve is tricuspid. There is moderate calcification of the aortic valve. There is moderate thickening of the aortic valve. Aortic valve regurgitation is mild. Moderate to severe aortic stenosis is present. Aortic valve mean gradient measures 28.0 mmHg. Aortic valve peak gradient measures 49.0 mmHg. Aortic valve area, by VTI measures 0.67 cm. Pulmonic Valve: The pulmonic valve was normal in structure. Pulmonic valve regurgitation is trivial. No evidence of pulmonic stenosis. Aorta: The aortic root is normal in size and structure. Pulmonary Artery: The pulmonary artery is of normal size. Venous: The inferior vena cava is normal in size with greater than 50% respiratory variability, suggesting right atrial pressure of 3 mmHg. IAS/Shunts: No atrial level shunt detected by color flow  Doppler.  LEFT VENTRICLE PLAX 2D LVIDd:         3.60 cm  Diastology LVIDs:         3.20 cm  LV e' medial:    3.26 cm/s LV PW:         1.50 cm  LV E/e' medial:  34.7 LV IVS:        1.39 cm  LV e' lateral:   6.09 cm/s LVOT  diam:     1.90 cm  LV E/e' lateral: 18.6 LV SV:         49 LV SV Index:   27 LVOT Area:     2.84 cm  RIGHT VENTRICLE RV Basal diam:  2.70 cm LEFT ATRIUM             Index       RIGHT ATRIUM           Index LA diam:        4.70 cm 2.56 cm/m  RA Area:     12.50 cm LA Vol (A2C):   72.7 ml 39.54 ml/m RA Volume:   27.60 ml  15.01 ml/m LA Vol (A4C):   97.0 ml 52.76 ml/m LA Biplane Vol: 86.8 ml 47.21 ml/m  AORTIC VALVE                    PULMONIC VALVE AV Area (Vmax):    0.72 cm     PV Vmax:       2.60 m/s AV Area (Vmean):   0.77 cm     PV Vmean:      191.000 cm/s AV Area (VTI):     0.67 cm     PV VTI:        0.546 m AV Vmax:           350.00 cm/s  PV Peak grad:  27.0 mmHg AV Vmean:          241.500 cm/s PV Mean grad:  16.0 mmHg AV VTI:            0.723 m AV Peak Grad:      49.0 mmHg AV Mean Grad:      28.0 mmHg LVOT Vmax:         88.80 cm/s LVOT Vmean:        65.200 cm/s LVOT VTI:          0.172 m LVOT/AV VTI ratio: 0.24  AORTA Ao Root diam: 3.00 cm MITRAL VALVE MV Area (PHT): 2.58 cm     SHUNTS MV Area VTI:   1.37 cm     Systemic VTI:  0.17 m MV Peak grad:  5.3 mmHg     Systemic Diam: 1.90 cm MV Mean grad:  3.0 mmHg MV Vmax:       1.15 m/s MV Vmean:      77.1 cm/s MV Decel Time: 294 msec MV E velocity: 113.00 cm/s MV A velocity: 99.00 cm/s MV E/A ratio:  1.14 Christopher End MD Electronically signed by Nelva Bush MD Signature Date/Time: 05/17/2020/10:48:29 AM    Final      CODE STATUS:     Code Status Orders  (From admission, onward)         Start     Ordered   05/18/20 1505  Do not attempt resuscitation (DNR)  Continuous       Question Answer Comment  In the event of cardiac or respiratory ARREST Do not call a "code blue"   In the event of cardiac or respiratory ARREST Do  not perform Intubation, CPR, defibrillation or ACLS   In the event of cardiac or respiratory ARREST Use medication by any route, position, wound care, and other measures to relive pain and suffering. May use oxygen, suction and manual treatment of airway obstruction as needed for comfort.   Comments per pt request      05/18/20 1504        Code  Status History    Date Active Date Inactive Code Status Order ID Comments User Context   05/15/2020 1640 05/18/2020 1504 Full Code [id number]  Collier Bullock, MD ED   08/21/2016 1407 08/24/2016 0014 Full Code [id number]  Hessie Knows, MD Inpatient   Advance Care Planning Activity       TOTAL TIME TAKING CARE OF THIS PATIENT: *40* minutes.    Fritzi Mandes M.D  [facility]    CC: Primary care physician; Derinda Late, MD

## 2020-05-19 NOTE — TOC Transition Note (Signed)
Transition of Care [facility]) - CM/SW Discharge Note   Patient Details  Name: Paula Powell MRN: [id number] Date of Birth: 03-17-1932  [facility] CM/SW Contact:  Beverly Sessions, RN Phone Number: 05/19/2020, 9:12 AM   Clinical Narrative:     Patient to discharge to [name] sent in the [name]  Notified by [employer] that auth has been approved for 7 days.  Auth number [id number]  EMS transport has been denied.  Patient and granddaughter notified Granddaughter to pick patient up at 1130 and transport  Bedside RN to call report   Final next level of care: Skilled Nursing Facility Barriers to Discharge: No Barriers Identified   Patient Goals and CMS Choice Patient states their goals for this hospitalization and ongoing recovery are:: to get better   Choice offered to / list presented to : Patient  Discharge Placement              Patient chooses bed at: [facility] Patient to be transferred to facility by: Grandaughter Name of family member notified: [name] Patient and family notified of of transfer: 05/19/20  Discharge Plan and Services In-house Referral: Clinical Social Work   Post Acute Care Choice: [place]                               Social Determinants of Health (SDOH) Interventions     Readmission Risk Interventions No flowsheet data found.

## 2020-05-19 NOTE — Progress Notes (Signed)
Progress Note  Patient Name: Paula Powell Date of Encounter: 05/19/2020  [facility] Cardiologist: [facility]  Subjective   Reports breathing back to her baseline, feels well, no chest pain, no leg swelling no shortness of breath Discussed echocardiogram findings with her, aortic valve stenosis  Feels her legs are weak, going to [facility] for rehab  Right left heart catheterization previously addressed with her, she was reluctant, again reluctant on today's visit  Inpatient Medications    Scheduled Meds: . clobetasol ointment   Topical BID  . enoxaparin (LOVENOX) injection  30 mg Subcutaneous Q24H  . losartan  100 mg Oral Daily  . mouth rinse  15 mL Mouth Rinse BID  . metoprolol tartrate  12.5 mg Oral BID  . sodium chloride flush  3 mL Intravenous Q12H   Continuous Infusions: . sodium chloride     PRN Meds: sodium chloride, acetaminophen, cyclobenzaprine, ondansetron (ZOFRAN) IV, sodium chloride flush   Vital Signs    Vitals:   05/19/20 0458 05/19/20 0501 05/19/20 0852 05/19/20 0909  BP:  125/69 (!) 145/53   Pulse:  (!) 59 (!) 58 67  Resp:  15 18   Temp:  97.6 F (36.4 C) 97.6 F (36.4 C)   TempSrc:  Oral    SpO2:  95% 95%   Weight: 72.3 kg     Height:        Intake/Output Summary (Last 24 hours) at 05/19/2020 1732 Last data filed at 05/19/2020 1002 Gross per 24 hour  Intake 463 ml  Output 0 ml  Net 463 ml   Last 3 Weights 05/19/2020 05/18/2020 05/17/2020  Weight (lbs) 159 lb 6.3 oz 163 lb 12.8 oz 163 lb 9.3 oz  Weight (kg) 72.3 kg 74.3 kg 74.2 kg      Telemetry    Normal sinus rhythm- Personally Reviewed  ECG    - Personally Reviewed  Physical Exam   GEN: No acute distress.   Neck: No JVD Cardiac: RRR, 3/6 systolic ejection murmur appreciated right sternal border No rubs, or gallops.  Respiratory: Clear to auscultation bilaterally. GI: Soft, nontender, non-distended  MS: No edema; No deformity. Neuro:  Nonfocal  Psych: Normal affect    Labs    High Sensitivity Troponin:   Recent Labs  Lab 05/15/20 1418 05/15/20 1703 05/15/20 1721  TROPONINIHS 113* 108* 113*      Chemistry Recent Labs  Lab 05/16/20 0844 05/17/20 0442 05/18/20 0441  NA 136 138 137  K 3.5 3.3* 3.9  CL 103 102 103  CO2 23 26 25   GLUCOSE 89 86 91  BUN 17 24* 28*  CREATININE 1.65* 1.66* 1.66*  CALCIUM 8.9 9.2 9.2  GFRNONAA 30* 30* 30*  ANIONGAP 10 10 9      Hematology Recent Labs  Lab 05/15/20 1418 05/16/20 0844 05/17/20 0442  WBC 5.5 6.4 5.0  RBC 3.69* 3.75* 3.53*  HGB 10.8* 11.1* 10.4*  HCT 33.5* 34.1* 31.8*  MCV 90.8 90.9 90.1  MCH 29.3 29.6 29.5  MCHC 32.2 32.6 32.7  RDW 13.5 13.4 13.4  PLT 248 238 225    BNP Recent Labs  Lab 05/15/20 1516 05/17/20 0442  BNP 2,183.7* 1,001.2*     DDimer No results for input(s): DDIMER in the last 168 hours.   Radiology    No results found.  Cardiac Studies   Echo 1. Left ventricular ejection fraction, by estimation, is 50 to 55%. The  left ventricle has low normal function. Left ventricular endocardial  border not  optimally defined to evaluate regional wall motion. There is  moderate left ventricular hypertrophy.  Left ventricular diastolic parameters are consistent with Grade II  diastolic dysfunction (pseudonormalization). Elevated left atrial  pressure.  2. Right ventricular systolic function is normal. The right ventricular  size is normal. Tricuspid regurgitation signal is inadequate for assessing  PA pressure.  3. Left atrial size was moderately dilated.  4. The pericardial effusion is posterior to the left ventricle.  5. The mitral valve is degenerative. Mild mitral valve regurgitation. No  evidence of mitral stenosis. Severe mitral annular calcification.  6. The aortic valve is tricuspid. There is moderate calcification of the  aortic valve. There is moderate thickening of the aortic valve. Aortic  valve regurgitation is mild. Moderate to severe aortic  valve stenosis.  Aortic valve area, by VTI measures  0.67 cm. Aortic valve mean gradient measures 28.0 mmHg. Aortic valve Vmax  measures 3.50 m/s.  7. The inferior vena cava is normal in size with greater than 50%  respiratory variability, suggesting right atrial pressure of 3 mmHg.   Patient Profile     87 y.o. female with a hx of HTN, depression, OA, tobacco usewho is being seen today for the evaluation ofCHF  Assessment & Plan    Acute respiratory distress In the setting of diastolic CHF, moderate to severe aortic valve stenosis, -Symptoms improved with Lasix weight down -Feels at her baseline, plan to restart Lasix 20 daily  Moderate to severe aortic valve stenosis Will need outpatient follow-up, echocardiogram 6 to 12 months  Essential hypertension On beta-blocker, losartan  Elevated troponin In the setting of respiratory distress, aortic valve stenosis, CT scan documenting coronary calcification She declined ischemic work-up at this time, We will need to readdress as outpatient    Total encounter time more than 35 minutes  Greater than 50% was spent in counseling and coordination of care with the patient   For questions or updates, please contact [facility] Please consult [email or web address] for contact info under        Signed, Ida Rogue, MD  05/19/2020, 5:32 PM

## 2020-05-19 NOTE — Progress Notes (Signed)
Mobility Specialist - Progress Note   05/19/20 1000  Mobility  Activity Transferred:  Bed to chair  Level of Assistance Minimal assist, patient does 75% or more  Assistive Device Front wheel walker  Distance Ambulated (ft) 3 ft  Mobility Response Tolerated well  Mobility performed by Mobility specialist  $Mobility charge 1 Mobility    Pt transferred from bed-recliner. HR ranging between 58-63 bpm. Alarm set.    Kathee Delton Mobility Specialist 05/19/20, 11:00 AM

## 2020-05-19 NOTE — Telephone Encounter (Signed)
LMOV to schedule  

## 2020-05-23 DIAGNOSIS — I350 Nonrheumatic aortic (valve) stenosis: Secondary | ICD-10-CM | POA: Diagnosis not present

## 2020-05-23 DIAGNOSIS — F3341 Major depressive disorder, recurrent, in partial remission: Secondary | ICD-10-CM | POA: Diagnosis not present

## 2020-05-23 DIAGNOSIS — I5032 Chronic diastolic (congestive) heart failure: Secondary | ICD-10-CM | POA: Diagnosis not present

## 2020-05-23 DIAGNOSIS — N1831 Chronic kidney disease, stage 3a: Secondary | ICD-10-CM | POA: Diagnosis not present

## 2020-05-23 NOTE — Telephone Encounter (Signed)
Attempted to schedule with [facility].  No ans .

## 2020-05-23 NOTE — Telephone Encounter (Signed)
-----   Message from Minna Merritts, MD sent at 05/19/2020 11:48 AM EDT ----- Needs appt, leaving hospital today Going to [facility] 1-2 weeks Thx TG

## 2020-05-31 NOTE — Telephone Encounter (Signed)
Attempted to schedule.  LMOV to call office.  ° °

## 2020-06-06 DIAGNOSIS — F17200 Nicotine dependence, unspecified, uncomplicated: Secondary | ICD-10-CM | POA: Diagnosis not present

## 2020-06-06 DIAGNOSIS — K589 Irritable bowel syndrome without diarrhea: Secondary | ICD-10-CM | POA: Diagnosis not present

## 2020-06-06 DIAGNOSIS — R0602 Shortness of breath: Secondary | ICD-10-CM | POA: Diagnosis not present

## 2020-06-06 DIAGNOSIS — Z96651 Presence of right artificial knee joint: Secondary | ICD-10-CM | POA: Diagnosis not present

## 2020-06-06 DIAGNOSIS — E785 Hyperlipidemia, unspecified: Secondary | ICD-10-CM | POA: Diagnosis not present

## 2020-06-06 DIAGNOSIS — N1831 Chronic kidney disease, stage 3a: Secondary | ICD-10-CM | POA: Diagnosis not present

## 2020-06-06 DIAGNOSIS — I130 Hypertensive heart and chronic kidney disease with heart failure and stage 1 through stage 4 chronic kidney disease, or unspecified chronic kidney disease: Secondary | ICD-10-CM | POA: Diagnosis not present

## 2020-06-06 DIAGNOSIS — D631 Anemia in chronic kidney disease: Secondary | ICD-10-CM | POA: Diagnosis not present

## 2020-06-06 DIAGNOSIS — G5600 Carpal tunnel syndrome, unspecified upper limb: Secondary | ICD-10-CM | POA: Diagnosis not present

## 2020-06-06 DIAGNOSIS — I350 Nonrheumatic aortic (valve) stenosis: Secondary | ICD-10-CM | POA: Diagnosis not present

## 2020-06-06 DIAGNOSIS — J309 Allergic rhinitis, unspecified: Secondary | ICD-10-CM | POA: Diagnosis not present

## 2020-06-06 DIAGNOSIS — L309 Dermatitis, unspecified: Secondary | ICD-10-CM | POA: Diagnosis not present

## 2020-06-06 DIAGNOSIS — M1990 Unspecified osteoarthritis, unspecified site: Secondary | ICD-10-CM | POA: Diagnosis not present

## 2020-06-06 DIAGNOSIS — R300 Dysuria: Secondary | ICD-10-CM | POA: Diagnosis not present

## 2020-06-06 DIAGNOSIS — I5032 Chronic diastolic (congestive) heart failure: Secondary | ICD-10-CM | POA: Diagnosis not present

## 2020-06-06 DIAGNOSIS — K5790 Diverticulosis of intestine, part unspecified, without perforation or abscess without bleeding: Secondary | ICD-10-CM | POA: Diagnosis not present

## 2020-06-06 DIAGNOSIS — F331 Major depressive disorder, recurrent, moderate: Secondary | ICD-10-CM | POA: Diagnosis not present

## 2020-06-06 DIAGNOSIS — M62838 Other muscle spasm: Secondary | ICD-10-CM | POA: Diagnosis not present

## 2020-06-07 NOTE — Progress Notes (Deleted)
   Patient ID: Paula Powell, female    DOB: [birth year], 87 y.o.   MRN: [id number]  HPI  Paula Powell is a 87 y/o female with a history of  Echo report from 05/17/20 reviewed and showed an EF of 50-55% along with moderate LVH, mild MR and moderate/ severe AS.   Admitted 05/15/20 due to shortness of breath. Cardiology consult obtained. Initially given IV lasix and then transitioned to oral diuretics & then held due to renal disease. Cath recommended but patient refused. Discharged after 4 days.   She presents today for her initial visit with a chief complaint of   Review of Systems    Physical Exam  Assessment & Plan:  1: Chronic heart failure with preserved ejection fraction with structural changes (LVH)- - NYHA class   2: HTN- - BP

## 2020-06-08 ENCOUNTER — Ambulatory Visit: Payer: PPO | Admitting: Family

## 2020-06-08 ENCOUNTER — Telehealth: Payer: Self-pay | Admitting: Family

## 2020-06-08 NOTE — Telephone Encounter (Signed)
Patient did not show for her [facility] appointment on 06/08/20. Will attempt to reschedule.

## 2020-06-13 DIAGNOSIS — K5190 Ulcerative colitis, unspecified, without complications: Secondary | ICD-10-CM | POA: Diagnosis not present

## 2020-06-14 DIAGNOSIS — I5032 Chronic diastolic (congestive) heart failure: Secondary | ICD-10-CM | POA: Diagnosis not present

## 2020-06-14 DIAGNOSIS — I130 Hypertensive heart and chronic kidney disease with heart failure and stage 1 through stage 4 chronic kidney disease, or unspecified chronic kidney disease: Secondary | ICD-10-CM | POA: Diagnosis not present

## 2020-06-15 DIAGNOSIS — A0471 Enterocolitis due to Clostridium difficile, recurrent: Secondary | ICD-10-CM | POA: Diagnosis not present

## 2020-06-17 DIAGNOSIS — D649 Anemia, unspecified: Secondary | ICD-10-CM | POA: Diagnosis not present

## 2020-06-20 ENCOUNTER — Emergency Department: Payer: PPO

## 2020-06-20 ENCOUNTER — Other Ambulatory Visit: Payer: Self-pay

## 2020-06-20 ENCOUNTER — Emergency Department
Admission: EM | Admit: 2020-06-20 | Discharge: 2020-06-20 | Disposition: A | Discharge status: Home / Self Care | Payer: PPO | Attending: Emergency Medicine | Admitting: Emergency Medicine

## 2020-06-20 DIAGNOSIS — Z79899 Other long term (current) drug therapy: Secondary | ICD-10-CM | POA: Diagnosis not present

## 2020-06-20 DIAGNOSIS — I358 Other nonrheumatic aortic valve disorders: Secondary | ICD-10-CM | POA: Diagnosis not present

## 2020-06-20 DIAGNOSIS — R112 Nausea with vomiting, unspecified: Secondary | ICD-10-CM | POA: Insufficient documentation

## 2020-06-20 DIAGNOSIS — Z96651 Presence of right artificial knee joint: Secondary | ICD-10-CM | POA: Insufficient documentation

## 2020-06-20 DIAGNOSIS — K7689 Other specified diseases of liver: Secondary | ICD-10-CM | POA: Diagnosis not present

## 2020-06-20 DIAGNOSIS — F17210 Nicotine dependence, cigarettes, uncomplicated: Secondary | ICD-10-CM | POA: Diagnosis not present

## 2020-06-20 DIAGNOSIS — I110 Hypertensive heart disease with heart failure: Secondary | ICD-10-CM | POA: Insufficient documentation

## 2020-06-20 DIAGNOSIS — K5190 Ulcerative colitis, unspecified, without complications: Secondary | ICD-10-CM | POA: Diagnosis not present

## 2020-06-20 DIAGNOSIS — N281 Cyst of kidney, acquired: Secondary | ICD-10-CM | POA: Diagnosis not present

## 2020-06-20 DIAGNOSIS — R109 Unspecified abdominal pain: Secondary | ICD-10-CM | POA: Diagnosis present

## 2020-06-20 DIAGNOSIS — K5180 Other ulcerative colitis without complications: Secondary | ICD-10-CM | POA: Insufficient documentation

## 2020-06-20 DIAGNOSIS — I509 Heart failure, unspecified: Secondary | ICD-10-CM | POA: Insufficient documentation

## 2020-06-20 DIAGNOSIS — K6389 Other specified diseases of intestine: Secondary | ICD-10-CM | POA: Diagnosis not present

## 2020-06-20 LAB — CBC
HCT: 34.8 % — ABNORMAL LOW (ref 36.0–46.0)
Hemoglobin: 11.7 g/dL — ABNORMAL LOW (ref 12.0–15.0)
MCH: 29.6 pg (ref 26.0–34.0)
MCHC: 33.6 g/dL (ref 30.0–36.0)
MCV: 88.1 fL (ref 80.0–100.0)
Platelets: 255 10*3/uL (ref 150–400)
RBC: 3.95 MIL/uL (ref 3.87–5.11)
RDW: 12.8 % (ref 11.5–15.5)
WBC: 8.5 10*3/uL (ref 4.0–10.5)
nRBC: 0 % (ref 0.0–0.2)

## 2020-06-20 LAB — COMPREHENSIVE METABOLIC PANEL
ALT: 9 U/L (ref 0–44)
AST: 15 U/L (ref 15–41)
Albumin: 3.1 g/dL — ABNORMAL LOW (ref 3.5–5.0)
Alkaline Phosphatase: 86 U/L (ref 38–126)
Anion gap: 13 (ref 5–15)
BUN: 44 mg/dL — ABNORMAL HIGH (ref 8–23)
CO2: 19 mmol/L — ABNORMAL LOW (ref 22–32)
Calcium: 8.4 mg/dL — ABNORMAL LOW (ref 8.9–10.3)
Chloride: 102 mmol/L (ref 98–111)
Creatinine, Ser: 1.93 mg/dL — ABNORMAL HIGH (ref 0.44–1.00)
GFR, Estimated: 25 mL/min — ABNORMAL LOW (ref >60)
Glucose, Bld: 97 mg/dL (ref 70–99)
Potassium: 3.3 mmol/L — ABNORMAL LOW (ref 3.5–5.1)
Sodium: 134 mmol/L — ABNORMAL LOW (ref 135–145)
Total Bilirubin: 1.2 mg/dL (ref 0.3–1.2)
Total Protein: 7.3 g/dL (ref 6.5–8.1)

## 2020-06-20 LAB — LIPASE, BLOOD: Lipase: 28 U/L (ref 11–51)

## 2020-06-20 MED ORDER — SODIUM CHLORIDE 0.9 % IV BOLUS
1000.0000 mL | Freq: Once | INTRAVENOUS | Status: AC
Start: 2020-06-20 — End: 2020-06-20
  Administered 2020-06-20: 1000 mL via INTRAVENOUS

## 2020-06-20 MED ORDER — LOPERAMIDE HCL 2 MG PO TABS: 2.0000 mg | ORAL_TABLET | Freq: Four times a day (QID) | ORAL | 0 refills | Status: AC | PRN

## 2020-06-20 MED ORDER — PREDNISONE 10 MG PO TABS: 10.0000 mg | ORAL_TABLET | Freq: Every day | ORAL | 0 refills | Status: AC

## 2020-06-20 MED ORDER — POTASSIUM CHLORIDE CRYS ER 20 MEQ PO TBCR
20.0000 meq | EXTENDED_RELEASE_TABLET | Freq: Once | ORAL | Status: AC
  Administered 2020-06-20: 20 meq via ORAL
  Filled 2020-06-20: qty 1

## 2020-06-20 NOTE — ED Notes (Signed)
Pt now able to void and having small bout of diarrhea. Will perform peri care prior to discharge.

## 2020-06-20 NOTE — ED Triage Notes (Signed)
Pt to ED via [facility], c/o frequent diarrhea past 2 weeks with occurrences 2 times per day over past week and decreased appetite for past week. EMS VS: 140/58, SPO2 98%, pulse 62, CBG 102. Pt has DNR paperwork with her. Pt in NAD at this time.

## 2020-06-20 NOTE — ED Provider Notes (Signed)
[facility] Emergency Department Provider Note  ____________________________________________  Time seen: Approximately 11:05 AM  I have reviewed the triage vital signs and the nursing notes.   HISTORY  Chief Complaint Diarrhea    HPI Paula Powell is a 87 y.o. female who presents the emergency department complaining of diffuse abdominal pain, nausea, vomiting, diarrhea x2 weeks.  Patient states that she has a history of irritable bowel syndrome/ulcerative colitis.  She is not sure if this is causing her symptoms or something else.  She states that since February she has had multiple issues including what appears to be pyelonephritis, congestive heart failure issues.  She was admitted a month ago for congestive heart failure exacerbation.  Patient states that she had been on several rounds of antibiotics for her pyelonephritis and that started to cause some intermittent diarrhea.  She states that she will have a day or 2 of diarrhea, then will go away, then will come back.  She has had continuous diarrhea for the past 2 weeks.  Intermittent nausea and reported emesis.  She denies any blood in the emesis or diarrhea.  She reports diffuse abdominal pain at this time.  No fevers or chills are reported.  Patient denies any fevers or chills, URI symptoms, chest pain, shortness of breath, peripheral edema, dysuria, polyuria, hematuria.        Past Medical History:  Diagnosis Date  . Allergic eczema   . Anemia   . Carpal tunnel syndrome   . Chest pain, atypical   . Depression   . Depressive disorder   . Diverticulosis   . DJD (degenerative joint disease)   . Eczema   . Essential hypertension   . H/O: hysterectomy   . Hives   . IBD (inflammatory bowel disease)   . Irritable bowel syndrome   . PONV (postoperative nausea and vomiting)    left knee arthroscopy  . Rhinitis, allergic   . Right carotid bruit     Patient Active Problem List   Diagnosis Date Noted   . Aortic valve stenosis   . Acute CHF (congestive heart failure) ([place]) 05/15/2020  . Nicotine dependence 05/15/2020  . Anemia of chronic disease 05/15/2020  . Elevated troponin 05/15/2020  . Depressive disorder   . Essential hypertension   . Primary osteoarthritis of right knee 08/21/2016    Past Surgical History:  Procedure Laterality Date  . ABDOMINAL HYSTERECTOMY    . BACK SURGERY  2008   [facility]  . CARPAL TUNNEL RELEASE Left 1990's  . COLONOSCOPY WITH PROPOFOL N/A 07/22/2014   Procedure: COLONOSCOPY WITH PROPOFOL;  Surgeon: Hulen Luster, MD;  Location[facility] ENDOSCOPY;  Service: Gastroenterology;  Laterality: N/A;  . JOINT REPLACEMENT    . OOPHORECTOMY Right 2003  . RECTAL SURGERY  2003  . TOTAL KNEE ARTHROPLASTY Right 08/21/2016   Procedure: TOTAL KNEE ARTHROPLASTY;  Surgeon: Hessie Knows, MD;  Location: [facility] ORS;  Service: Orthopedics;  Laterality: Right;    Prior to Admission medications   Medication Sig Start Date End Date Taking? Authorizing Provider  loperamide (IMODIUM A-D) 2 MG tablet Take 1 tablet (2 mg total) by mouth 4 (four) times daily as needed for diarrhea or loose stools. 06/20/20  Yes Satia Winger, Charline Bills, PA-C  predniSONE (DELTASONE) 10 MG tablet Take 1 tablet (10 mg total) by mouth daily. 06/20/20  Yes Zerick Prevette, Charline Bills, PA-C  acetaminophen (TYLENOL) 500 MG tablet Take 1,000 mg by mouth every 6 (six) hours as needed for mild pain.  [provider]  clobetasol ointment (TEMOVATE) 0.05 % Apply topically 2 (two) times daily. 07/30/19 07/29/20  [provider]  cyclobenzaprine (FLEXERIL) 10 MG tablet Take 10 mg by mouth 3 (three) times daily as needed for muscle spasms.    [provider]  furosemide (LASIX) 20 MG tablet Take 1 tablet (20 mg total) by mouth daily. 05/20/20 05/20/21  Fritzi Mandes, MD  losartan (COZAAR) 100 MG tablet Take 100 mg by mouth daily. In am.    [provider]  metoprolol tartrate (LOPRESSOR) 25 MG tablet Take 0.5  tablets (12.5 mg total) by mouth 2 (two) times daily. 05/19/20   Fritzi Mandes, MD    Allergies Ace inhibitors and Ciprofloxacin  Family History  Problem Relation Age of Onset  . Breast cancer Paternal Grandmother     Social History Social History   Tobacco Use  . Smoking status: Current Every Day Smoker    Packs/day: 1.00  . Smokeless tobacco: Never Used  Substance Use Topics  . Alcohol use: No  . Drug use: No     Review of Systems  Constitutional: No fever/chills Eyes: No visual changes. No discharge ENT: No upper respiratory complaints. Cardiovascular: no chest pain. Respiratory: no cough. No SOB. Gastrointestinal: Diffuse, nonspecific abdominal pain.  Intermittent nausea and vomiting..  2 weeks of diarrhea.  No constipation. Genitourinary: Negative for dysuria. No hematuria Musculoskeletal: Negative for musculoskeletal pain. Skin: Negative for rash, abrasions, lacerations, ecchymosis. Neurological: Negative for headaches, focal weakness or numbness.  10 System ROS otherwise negative.  ____________________________________________   PHYSICAL EXAM:  VITAL SIGNS: ED Triage Vitals  Enc Vitals Group     BP 06/20/20 1044 (!) 115/91     Pulse Rate 06/20/20 1044 67     Resp 06/20/20 1044 16     Temp 06/20/20 1044 (!) 97.5 F (36.4 C)     Temp Source 06/20/20 1044 Oral     SpO2 06/20/20 1044 100 %     Weight 06/20/20 1045 160 lb (72.6 kg)     Height 06/20/20 1045 5\' 6"  (1.676 m)     Head Circumference --      Peak Flow --      Pain Score 06/20/20 1045 0     Pain Loc --      Pain Edu? --      Excl. in [place]? --      Constitutional: Alert and oriented. Well appearing and in no acute distress. Eyes: Conjunctivae are normal. PERRL. EOMI. Head: Atraumatic. ENT:      Ears:       Nose: No congestion/rhinnorhea.      Mouth/Throat: Mucous membranes are moist.  Neck: No stridor.    Cardiovascular: Normal rate, regular rhythm. Normal S1 and S2.  Good peripheral  circulation. Respiratory: Normal respiratory effort without tachypnea or retractions. Lungs CTAB. Good air entry to the bases with no decreased or absent breath sounds. Gastrointestinal: Visualization of external abdominal wall reveals no obvious findings.  Bowel sounds 4 quadrants.  Soft to palpation all quadrants.  Patient is mildly diffusely tender throughout the right abdomen.  Both right upper and lower quadrant.  Mild guarding on the right side.  No tenderness along the left, no guarding on the left side either.  No rigidity. No palpable masses. No distention. No CVA tenderness. Musculoskeletal: Full range of motion to all extremities. No gross deformities appreciated. Neurologic:  Normal speech and language. No gross focal neurologic deficits are appreciated.  Skin:  Skin is warm,  dry and intact. No rash noted. Psychiatric: Mood and affect are normal. Speech and behavior are normal. Patient exhibits appropriate insight and judgement.   ____________________________________________   LABS (all labs ordered are listed, but only abnormal results are displayed)  Labs Reviewed  CBC - Abnormal; Notable for the following components:      Result Value   Hemoglobin 11.7 (*)    HCT 34.8 (*)    All other components within normal limits  COMPREHENSIVE METABOLIC PANEL - Abnormal; Notable for the following components:   Sodium 134 (*)    Potassium 3.3 (*)    CO2 19 (*)    BUN 44 (*)    Creatinine, Ser 1.93 (*)    Calcium 8.4 (*)    Albumin 3.1 (*)    GFR, Estimated 25 (*)    All other components within normal limits  LIPASE, BLOOD  URINALYSIS, COMPLETE (UACMP) WITH MICROSCOPIC   ____________________________________________  EKG   ____________________________________________  RADIOLOGY I personally viewed and evaluated these images as part of my medical decision making, as well as reviewing the written report by the radiologist.  ED Provider Interpretation: Findings consistent with  ulcerative colitis without evidence of other acute finding.  CT ABDOMEN PELVIS WO CONTRAST  Result Date: 06/20/2020 CLINICAL DATA:  Diffuse abdominal pain EXAM: CT ABDOMEN AND PELVIS WITHOUT CONTRAST TECHNIQUE: Multidetector CT imaging of the abdomen and pelvis was performed following the standard protocol without IV contrast. COMPARISON:  05/25/2015 FINDINGS: Lower chest: Calcified aortic valve, mitral valve and descending aorta. No acute findings Hepatobiliary: Small subcentimeter hypodensity in the right hepatic lobe, likely small cysts but difficult to characterize due to its small size and noncontrast study. Gallbladder grossly unremarkable. Pancreas: Atrophy.  No focal abnormality or ductal dilatation. Spleen: No focal abnormality.  Normal size. Adrenals/Urinary Tract: Bilateral renal cysts are similar to prior study. Adrenal glands unremarkable. No hydronephrosis. Urinary bladder unremarkable. Stomach/Bowel: Mild wall thickening noted throughout the colon, most notable in the ascending colon, transverse colon and descending colon. Stomach and small bowel decompressed, unremarkable. Vascular/Lymphatic: Aortoiliac atherosclerosis. No evidence of aneurysm or adenopathy. Reproductive: Prior hysterectomy.  No adnexal masses. Other: No free fluid or free air. Musculoskeletal: No acute bony abnormality. Degenerative changes throughout the lumbar spine. IMPRESSION: Mild wall thickening involving the ascending colon, transverse colon and descending colon compatible with colitis. Ancillary findings as above. Electronically Signed   By: Rolm Baptise M.D.   On: 06/20/2020 12:19    ____________________________________________    PROCEDURES  Procedure(s) performed:    Procedures    Medications  sodium chloride 0.9 % bolus 1,000 mL (1,000 mLs Intravenous New Bag/Given 06/20/20 1236)  potassium chloride SA (KLOR-CON) CR tablet 20 mEq (20 mEq Oral Given 06/20/20 1237)      ____________________________________________   INITIAL IMPRESSION / ASSESSMENT AND PLAN / ED COURSE  Pertinent labs & imaging results that were available during my care of the patient were reviewed by me and considered in my medical decision making (see chart for details).  Review of the [place] CSRS was performed in accordance of the [place] prior to dispensing any controlled drugs.           Patient's diagnosis is consistent with ulcerative colitis with diarrhea.  Patient presented to the emergency department complaining of diarrhea off and on since February.  Patient states that it has been consistent, daily diarrhea over the past 2 weeks.  Patient has noticed no blood.  She has been seen by her primary care and has  been referred back to her GI doctor.  Patient has been on mesalamine in the past but went into remission and had not been on any daily medicine for the past 3 years.  Patient states that initially she was very strict with her diet and had good results but has noticed that she has slept in regards to eating things that she knows she should not.  Patient is not experiencing any sharp abdominal pain but had some diffuse nonspecific pain with tenderness on the right side.  Imaging revealed findings consistent with ulcerative colitis without evidence of perforation or other acute findings.  Patient had mild bump in her creatinine and mild hyperkalemia also consistent with daily diarrhea x2 weeks.  Patient is given IV fluids and potassium here.  Given the findings patient will be placed on steroid taper and loperamide.  Follow-up with GI which has already been referred by primary care..  Return precautions discussed with the patient.  Patient is given ED precautions to return to the ED for any worsening or new symptoms.     ____________________________________________  FINAL CLINICAL IMPRESSION(S) / ED DIAGNOSES  Final diagnoses:  Other ulcerative colitis without complication ([place])       NEW MEDICATIONS STARTED DURING THIS VISIT:  ED Discharge Orders         Ordered    predniSONE (DELTASONE) 10 MG tablet  Daily       Note to Pharmacy: Take 6 pills x 2 days, 5 pills x 2 days, 4 pills x 2 days, 3 pills x 2 days, 2 pills x 2 days, and 1 pill x 2 days   06/20/20 1248    loperamide (IMODIUM A-D) 2 MG tablet  4 times daily PRN        06/20/20 1248              This chart was dictated using voice recognition software/Dragon. Despite best efforts to proofread, errors can occur which can change the meaning. Any change was purely unintentional.   Darletta Moll, PA-C 06/20/20 1256    Lavonia Drafts, MD 06/20/20 1346

## 2020-06-23 NOTE — Telephone Encounter (Signed)
Attempted to schedule no ans no vm  

## 2020-06-30 DIAGNOSIS — K51919 Ulcerative colitis, unspecified with unspecified complications: Secondary | ICD-10-CM | POA: Diagnosis not present

## 2020-06-30 DIAGNOSIS — R197 Diarrhea, unspecified: Secondary | ICD-10-CM | POA: Diagnosis not present

## 2020-07-01 DIAGNOSIS — R197 Diarrhea, unspecified: Secondary | ICD-10-CM | POA: Diagnosis not present

## 2020-07-01 DIAGNOSIS — K51919 Ulcerative colitis, unspecified with unspecified complications: Secondary | ICD-10-CM | POA: Diagnosis not present

## 2020-07-06 DIAGNOSIS — R7989 Other specified abnormal findings of blood chemistry: Secondary | ICD-10-CM | POA: Diagnosis not present

## 2020-07-09 ENCOUNTER — Emergency Department
Admission: EM | Admit: 2020-07-09 | Discharge: 2020-07-09 | Disposition: A | Discharge status: Home / Self Care | Payer: PPO | Attending: Emergency Medicine | Admitting: Emergency Medicine

## 2020-07-09 ENCOUNTER — Emergency Department: Payer: PPO

## 2020-07-09 ENCOUNTER — Other Ambulatory Visit: Payer: Self-pay

## 2020-07-09 DIAGNOSIS — Z79899 Other long term (current) drug therapy: Secondary | ICD-10-CM | POA: Diagnosis not present

## 2020-07-09 DIAGNOSIS — M2550 Pain in unspecified joint: Secondary | ICD-10-CM | POA: Diagnosis not present

## 2020-07-09 DIAGNOSIS — K529 Noninfective gastroenteritis and colitis, unspecified: Secondary | ICD-10-CM | POA: Diagnosis not present

## 2020-07-09 DIAGNOSIS — I959 Hypotension, unspecified: Secondary | ICD-10-CM | POA: Diagnosis not present

## 2020-07-09 DIAGNOSIS — Z7401 Bed confinement status: Secondary | ICD-10-CM | POA: Diagnosis not present

## 2020-07-09 DIAGNOSIS — R0902 Hypoxemia: Secondary | ICD-10-CM | POA: Diagnosis not present

## 2020-07-09 DIAGNOSIS — K5790 Diverticulosis of intestine, part unspecified, without perforation or abscess without bleeding: Secondary | ICD-10-CM | POA: Diagnosis not present

## 2020-07-09 DIAGNOSIS — I110 Hypertensive heart disease with heart failure: Secondary | ICD-10-CM | POA: Insufficient documentation

## 2020-07-09 DIAGNOSIS — K6389 Other specified diseases of intestine: Secondary | ICD-10-CM | POA: Diagnosis not present

## 2020-07-09 DIAGNOSIS — R1084 Generalized abdominal pain: Secondary | ICD-10-CM | POA: Diagnosis not present

## 2020-07-09 DIAGNOSIS — I509 Heart failure, unspecified: Secondary | ICD-10-CM | POA: Insufficient documentation

## 2020-07-09 DIAGNOSIS — F17200 Nicotine dependence, unspecified, uncomplicated: Secondary | ICD-10-CM | POA: Diagnosis not present

## 2020-07-09 DIAGNOSIS — N2889 Other specified disorders of kidney and ureter: Secondary | ICD-10-CM | POA: Diagnosis not present

## 2020-07-09 DIAGNOSIS — R197 Diarrhea, unspecified: Secondary | ICD-10-CM | POA: Diagnosis not present

## 2020-07-09 DIAGNOSIS — R109 Unspecified abdominal pain: Secondary | ICD-10-CM | POA: Diagnosis present

## 2020-07-09 LAB — CBC
HCT: 34.5 % — ABNORMAL LOW (ref 36.0–46.0)
Hemoglobin: 11.3 g/dL — ABNORMAL LOW (ref 12.0–15.0)
MCH: 29 pg (ref 26.0–34.0)
MCHC: 32.8 g/dL (ref 30.0–36.0)
MCV: 88.7 fL (ref 80.0–100.0)
Platelets: 191 10*3/uL (ref 150–400)
RBC: 3.89 MIL/uL (ref 3.87–5.11)
RDW: 13.1 % (ref 11.5–15.5)
WBC: 5.8 10*3/uL (ref 4.0–10.5)
nRBC: 0 % (ref 0.0–0.2)

## 2020-07-09 LAB — COMPREHENSIVE METABOLIC PANEL
ALT: 11 U/L (ref 0–44)
AST: 16 U/L (ref 15–41)
Albumin: 2.2 g/dL — ABNORMAL LOW (ref 3.5–5.0)
Alkaline Phosphatase: 122 U/L (ref 38–126)
Anion gap: 12 (ref 5–15)
BUN: 47 mg/dL — ABNORMAL HIGH (ref 8–23)
CO2: 22 mmol/L (ref 22–32)
Calcium: 8.3 mg/dL — ABNORMAL LOW (ref 8.9–10.3)
Chloride: 99 mmol/L (ref 98–111)
Creatinine, Ser: 1.86 mg/dL — ABNORMAL HIGH (ref 0.44–1.00)
GFR, Estimated: 26 mL/min — ABNORMAL LOW (ref >60)
Glucose, Bld: 103 mg/dL — ABNORMAL HIGH (ref 70–99)
Potassium: 3.2 mmol/L — ABNORMAL LOW (ref 3.5–5.1)
Sodium: 133 mmol/L — ABNORMAL LOW (ref 135–145)
Total Bilirubin: 1.1 mg/dL (ref 0.3–1.2)
Total Protein: 6.2 g/dL — ABNORMAL LOW (ref 6.5–8.1)

## 2020-07-09 LAB — LIPASE, BLOOD: Lipase: 39 U/L (ref 11–51)

## 2020-07-09 MED ORDER — ONDANSETRON 4 MG PO TBDP: 4.0000 mg | ORAL_TABLET | Freq: Three times a day (TID) | ORAL | 0 refills | Status: AC | PRN

## 2020-07-09 MED ORDER — METRONIDAZOLE 500 MG PO TABS: 500.0000 mg | ORAL_TABLET | Freq: Two times a day (BID) | ORAL | 0 refills | Status: AC

## 2020-07-09 MED ORDER — SODIUM CHLORIDE 0.9 % IV BOLUS
500.0000 mL | Freq: Once | INTRAVENOUS | Status: AC
  Administered 2020-07-09: 500 mL via INTRAVENOUS

## 2020-07-09 MED ORDER — SODIUM CHLORIDE 0.9 % IV SOLN
1000.0000 mL | Freq: Once | INTRAVENOUS | Status: AC
  Administered 2020-07-09: 1000 mL via INTRAVENOUS

## 2020-07-09 NOTE — ED Triage Notes (Signed)
Pt comes ems from [facility] for diarrhea, abd pain, and not eating for 3 days. Pt has hx of the same. Pt has DNR with her.

## 2020-07-09 NOTE — ED Notes (Signed)
Pt cleansed of small stool and given new brief. Placed on purewick.

## 2020-07-09 NOTE — ED Provider Notes (Signed)
[facility] Emergency Department Provider Note   ____________________________________________    I have reviewed the triage vital signs and the nursing notes.   HISTORY  Chief Complaint Nausea and Abdominal Pain     HPI Paula Powell is a 87 y.o. 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  This is apparently worsened over the last 3 days.  No recent antibiotic use reported.  No fevers or chills reported.  Patient reports her mouth feels dry and that she is thirsty  Past Medical History:  Diagnosis Date  . Allergic eczema   . Anemia   . Carpal tunnel syndrome   . Chest pain, atypical   . Depression   . Depressive disorder   . Diverticulosis   . DJD (degenerative joint disease)   . Eczema   . Essential hypertension   . H/O: hysterectomy   . Hives   . IBD (inflammatory bowel disease)   . Irritable bowel syndrome   . PONV (postoperative nausea and vomiting)    left knee arthroscopy  . Rhinitis, allergic   . Right carotid bruit     Patient Active Problem List   Diagnosis Date Noted  . Aortic valve stenosis   . Acute CHF (congestive heart failure) ([place]) 05/15/2020  . Nicotine dependence 05/15/2020  . Anemia of chronic disease 05/15/2020  . Elevated troponin 05/15/2020  . Depressive disorder   . Essential hypertension   . Primary osteoarthritis of right knee 08/21/2016    Past Surgical History:  Procedure Laterality Date  . ABDOMINAL HYSTERECTOMY    . BACK SURGERY  2008   [facility]  . CARPAL TUNNEL RELEASE Left 1990's  . COLONOSCOPY WITH PROPOFOL N/A 07/22/2014   Procedure: COLONOSCOPY WITH PROPOFOL;  Surgeon: Hulen Luster, MD;  Location[facility] ENDOSCOPY;  Service: Gastroenterology;  Laterality: N/A;  . JOINT REPLACEMENT    . OOPHORECTOMY Right 2003  . RECTAL SURGERY  2003  . TOTAL KNEE ARTHROPLASTY Right 08/21/2016   Procedure: TOTAL KNEE ARTHROPLASTY;  Surgeon: Hessie Knows,  MD;  Location: [facility] ORS;  Service: Orthopedics;  Laterality: Right;    Prior to Admission medications   Medication Sig Start Date End Date Taking? Authorizing Provider  metroNIDAZOLE (FLAGYL) 500 MG tablet Take 1 tablet (500 mg total) by mouth 2 (two) times daily after a meal. 07/09/20  Yes Lavonia Drafts, MD  ondansetron (ZOFRAN ODT) 4 MG disintegrating tablet Take 1 tablet (4 mg total) by mouth every 8 (eight) hours as needed. 07/09/20  Yes Lavonia Drafts, MD  acetaminophen (TYLENOL) 500 MG tablet Take 1,000 mg by mouth every 6 (six) hours as needed for mild pain.    [provider]  clobetasol ointment (TEMOVATE) 0.05 % Apply topically 2 (two) times daily. 07/30/19 07/29/20  [provider]  cyclobenzaprine (FLEXERIL) 10 MG tablet Take 10 mg by mouth 3 (three) times daily as needed for muscle spasms.    [provider]  furosemide (LASIX) 20 MG tablet Take 1 tablet (20 mg total) by mouth daily. 05/20/20 05/20/21  Fritzi Mandes, MD  loperamide (IMODIUM A-D) 2 MG tablet Take 1 tablet (2 mg total) by mouth 4 (four) times daily as needed for diarrhea or loose stools. 06/20/20   Cuthriell, Charline Bills, PA-C  losartan (COZAAR) 100 MG tablet Take 100 mg by mouth daily. In am.    [provider]  metoprolol tartrate (LOPRESSOR) 25 MG tablet Take 0.5 tablets (12.5 mg  total) by mouth 2 (two) times daily. 05/19/20   Fritzi Mandes, MD  predniSONE (DELTASONE) 10 MG tablet Take 1 tablet (10 mg total) by mouth daily. 06/20/20   Cuthriell, Charline Bills, PA-C     Allergies Ace inhibitors and Ciprofloxacin  Family History  Problem Relation Age of Onset  . Breast cancer Paternal Grandmother     Social History Social History   Tobacco Use  . Smoking status: Current Every Day Smoker    Packs/day: 1.00  . Smokeless tobacco: Never Used  Substance Use Topics  . Alcohol use: No  . Drug use: No    Review of Systems  Constitutional: No fever/chills Eyes: No visual changes.  ENT: Dry  mouth Cardiovascular: Denies chest pain. Respiratory: Denies shortness of breath. Gastrointestinal: As above Genitourinary: Negative for dysuria. Musculoskeletal: Negative for back pain. Skin: Negative for rash. Neurological: Negative for headaches    ____________________________________________   PHYSICAL EXAM:  VITAL SIGNS: ED Triage Vitals [07/09/20 0839]  Enc Vitals Group     BP      Pulse      Resp      Temp      Temp src      SpO2      Weight 72 kg (158 lb 11.7 oz)     Height 1.676 m (5\' 6" )     Head Circumference      Peak Flow      Pain Score      Pain Loc      Pain Edu?      Excl. in [place]?     Constitutional: Alert, no acute distress Eyes: Conjunctivae are normal.  Head: Atraumatic. Nose: No congestion/rhinnorhea. Mouth/Throat: Mucous membranes are dry  Cardiovascular: Normal rate, regular rhythm. Grossly normal heart sounds.  Good peripheral circulation. Respiratory: Normal respiratory effort.  No retractions. Lungs CTAB. Gastrointestinal: Soft, mild tenderness left lower quadrant no distention.  No CVA tenderness. Musculoskeletal:  Warm and well perfused Neurologic:  Normal speech and language. No gross focal neurologic deficits are appreciated.  Skin:  Skin is warm, dry and intact. No rash noted. Psychiatric: Mood and affect are normal. Speech and behavior are normal.  ____________________________________________   LABS (all labs ordered are listed, but only abnormal results are displayed)  Labs Reviewed  CBC - Abnormal; Notable for the following components:      Result Value   Hemoglobin 11.3 (*)    HCT 34.5 (*)    All other components within normal limits  COMPREHENSIVE METABOLIC PANEL - Abnormal; Notable for the following components:   Sodium 133 (*)    Potassium 3.2 (*)    Glucose, Bld 103 (*)    BUN 47 (*)    Creatinine, Ser 1.86 (*)    Calcium 8.3 (*)    Total Protein 6.2 (*)    Albumin 2.2 (*)    GFR, Estimated 26 (*)    All other  components within normal limits  LIPASE, BLOOD   ____________________________________________  EKG  None ____________________________________________  RADIOLOGY  CT abdomen pelvis ____________________________________________   PROCEDURES  Procedure(s) performed: No  Procedures   Critical Care performed: No ____________________________________________   INITIAL IMPRESSION / ASSESSMENT AND PLAN / ED COURSE  Pertinent labs & imaging results that were available during my care of the patient were reviewed by me and considered in my medical decision making (see chart for details).  Patient presents with abdominal pain as detailed above, mild left lower quadrant tenderness to palpation suspicious for diverticulitis.  Pending labs, will give IV fluid bolus  CT scan is consistent with her chronic colitis, patient treated with IV fluid bolus, well-appearing on reexamination, talk to family.  Appropriate for discharge, outpatient follow-up.    ____________________________________________   FINAL CLINICAL IMPRESSION(S) / ED DIAGNOSES  Final diagnoses:  Colitis        Note:  This document was prepared using Dragon voice recognition software and may include unintentional dictation errors.   Lavonia Drafts, MD 07/09/20 1128

## 2020-07-09 NOTE — ED Notes (Signed)
Called [facility] for transport back to [place]

## 2020-07-13 DIAGNOSIS — I5032 Chronic diastolic (congestive) heart failure: Secondary | ICD-10-CM | POA: Diagnosis not present

## 2020-07-13 DIAGNOSIS — Z0000 Encounter for general adult medical examination without abnormal findings: Secondary | ICD-10-CM | POA: Diagnosis not present

## 2020-07-13 DIAGNOSIS — K5190 Ulcerative colitis, unspecified, without complications: Secondary | ICD-10-CM | POA: Diagnosis not present

## 2020-08-12 DEATH — deceased
